# Patient Record
Sex: Female | Born: 1975 | Race: White | Hispanic: No | Marital: Married | Smoking: Current every day smoker
Health system: Southern US, Community
[De-identification: ages and names within clinical notes are randomized; demographics above are authoritative.]

## PROBLEM LIST (undated history)

## (undated) ENCOUNTER — Inpatient Hospital Stay (HOSPITAL_COMMUNITY): Payer: Self-pay

---

## 2013-07-22 ENCOUNTER — Encounter (HOSPITAL_COMMUNITY): Payer: Self-pay

## 2013-07-22 ENCOUNTER — Inpatient Hospital Stay (HOSPITAL_COMMUNITY)
Admission: AD | Admit: 2013-07-22 | Discharge: 2013-07-22 | Disposition: A | Discharge status: Home / Self Care | Payer: Medicaid Other | Source: Ambulatory Visit | Attending: Obstetrics & Gynecology | Admitting: Obstetrics & Gynecology

## 2013-07-22 DIAGNOSIS — O0930 Supervision of pregnancy with insufficient antenatal care, unspecified trimester: Secondary | ICD-10-CM | POA: Insufficient documentation

## 2013-07-22 DIAGNOSIS — O99330 Smoking (tobacco) complicating pregnancy, unspecified trimester: Secondary | ICD-10-CM | POA: Insufficient documentation

## 2013-07-22 DIAGNOSIS — M545 Low back pain: Secondary | ICD-10-CM

## 2013-07-22 DIAGNOSIS — O99891 Other specified diseases and conditions complicating pregnancy: Secondary | ICD-10-CM | POA: Insufficient documentation

## 2013-07-22 DIAGNOSIS — M549 Dorsalgia, unspecified: Secondary | ICD-10-CM | POA: Insufficient documentation

## 2013-07-22 DIAGNOSIS — O0933 Supervision of pregnancy with insufficient antenatal care, third trimester: Secondary | ICD-10-CM

## 2013-07-22 LAB — URINALYSIS, ROUTINE W REFLEX MICROSCOPIC
Bilirubin Urine: NEGATIVE
Hgb urine dipstick: NEGATIVE
Ketones, ur: NEGATIVE mg/dL
Nitrite: NEGATIVE
Protein, ur: NEGATIVE mg/dL
Urobilinogen, UA: 0.2 mg/dL (ref 0.0–1.0)

## 2013-07-22 NOTE — MAU Provider Note (Signed)
  History     CSN: [id number]  Arrival date and time: 07/22/13 1113   None     No chief complaint on file.  HPI This is a 37 y.o. female at [redacted]w[redacted]d who presents for checkup due to having no prenatal care. Just recently tried calling health Dept, states cannot get in. Wants to know what she is having. Has longstanding back pain that is no worse than usual. States no other complaints to me. "Just want to know if my baby is ok".  RN note: Patient states she has not had prenatal care and did not know she was pregnant until 3 months ago. Last period was on 5-31. States she has been having some back pain that she has had for a while. Swelling in wrists from carpel tunnel. Denies bleeding or leaking and reports fetal movement.       OB History   Grav Para Term Preterm Abortions TAB SAB Ect Mult Living   5 2 2  2  2   2       History reviewed. No pertinent past medical history.  History reviewed. No pertinent past surgical history.  Family History  Problem Relation Age of Onset  . Hypertension Mother   . Heart disease Mother   . Diabetes Father   . Cancer Father   . Diabetes Maternal Aunt     History  Substance Use Topics  . Smoking status: Current Every Day Smoker  . Smokeless tobacco: Never Used  . Alcohol Use: No    Allergies: No Known Allergies  No prescriptions prior to admission    Review of Systems  Constitutional: Negative for fever, chills and malaise/fatigue.  Gastrointestinal: Negative for nausea, vomiting, abdominal pain, diarrhea and constipation.  Genitourinary: Negative for dysuria.  Musculoskeletal: Positive for back pain.  Neurological: Negative for dizziness, focal weakness, weakness and headaches.   Physical Exam   Blood pressure 136/81, pulse 68, temperature 98.4 F (36.9 C), temperature source Oral, resp. rate 16, height 5' 7.5" (1.715 m), weight 101.787 kg (224 lb 6.4 oz), last menstrual period 12/29/2012, SpO2 99.00%.  Physical Exam   Constitutional: She is oriented to person, place, and time. She appears well-developed and well-nourished. No distress.  HENT:  Head: Normocephalic.  Cardiovascular: Normal rate.   Respiratory: Effort normal.  GI: Soft. She exhibits no distension and no mass. There is no tenderness. There is no rebound and no guarding.  Fundal height 30cm FHR + by RN  Genitourinary: No vaginal discharge found.  Cervical exam negative  Musculoskeletal: Normal range of motion.  Neurological: She is alert and oriented to person, place, and time.  Skin: Skin is warm and dry.  Psychiatric: She has a normal mood and affect.    MAU Course  Procedures   Assessment and Plan  A:  SIUP at [redacted]w[redacted]d        + fetal heart tones       Size = Dates  P:  WIll schedule out patient [place] prior to clinic visit      Will refer to clinic      Has PN vitamins  [facility] 07/22/2013, 12:21 PM

## 2013-07-22 NOTE — MAU Note (Signed)
Patient states she has not had prenatal care and did not know she was pregnant until 3 months ago. Last period was on 5-31. States she has been having some back pain that she has had for a while. Swelling in wrists from carpel tunnel. Denies bleeding or leaking and reports fetal movement.

## 2013-07-23 ENCOUNTER — Encounter: Payer: Self-pay | Admitting: Advanced Practice Midwife

## 2013-08-08 ENCOUNTER — Ambulatory Visit (INDEPENDENT_AMBULATORY_CARE_PROVIDER_SITE_OTHER): Payer: Medicaid Other | Admitting: Advanced Practice Midwife

## 2013-08-08 ENCOUNTER — Encounter: Payer: Self-pay | Admitting: Advanced Practice Midwife

## 2013-08-08 ENCOUNTER — Other Ambulatory Visit (HOSPITAL_COMMUNITY)
Admission: RE | Admit: 2013-08-08 | Discharge: 2013-08-08 | Disposition: A | Discharge status: Home / Self Care | Payer: Medicaid Other | Source: Ambulatory Visit | Attending: Advanced Practice Midwife | Admitting: Advanced Practice Midwife

## 2013-08-08 VITALS — BP 136/94 | Temp 98.5°F | Wt 225.2 lb

## 2013-08-08 DIAGNOSIS — Z124 Encounter for screening for malignant neoplasm of cervix: Secondary | ICD-10-CM | POA: Insufficient documentation

## 2013-08-08 DIAGNOSIS — O09529 Supervision of elderly multigravida, unspecified trimester: Secondary | ICD-10-CM

## 2013-08-08 DIAGNOSIS — Z1151 Encounter for screening for human papillomavirus (HPV): Secondary | ICD-10-CM | POA: Insufficient documentation

## 2013-08-08 DIAGNOSIS — O09523 Supervision of elderly multigravida, third trimester: Secondary | ICD-10-CM

## 2013-08-08 DIAGNOSIS — O0930 Supervision of pregnancy with insufficient antenatal care, unspecified trimester: Secondary | ICD-10-CM

## 2013-08-08 DIAGNOSIS — Z113 Encounter for screening for infections with a predominantly sexual mode of transmission: Secondary | ICD-10-CM | POA: Insufficient documentation

## 2013-08-08 DIAGNOSIS — O0933 Supervision of pregnancy with insufficient antenatal care, third trimester: Secondary | ICD-10-CM | POA: Insufficient documentation

## 2013-08-08 DIAGNOSIS — R87810 Cervical high risk human papillomavirus (HPV) DNA test positive: Secondary | ICD-10-CM | POA: Insufficient documentation

## 2013-08-08 LAB — POCT URINALYSIS DIP (DEVICE)
Bilirubin Urine: NEGATIVE
Glucose, UA: NEGATIVE mg/dL
Hgb urine dipstick: NEGATIVE
Ketones, ur: NEGATIVE mg/dL
Leukocytes, UA: NEGATIVE
NITRITE: NEGATIVE
Protein, ur: NEGATIVE mg/dL
Specific Gravity, Urine: 1.025 (ref 1.005–1.030)
Urobilinogen, UA: 0.2 mg/dL (ref 0.0–1.0)
pH: 7 (ref 5.0–8.0)

## 2013-08-08 NOTE — Progress Notes (Signed)
Pulse- 101  Pain/pressure-lower abd Weight gain 25-35lbs New ob packet given

## 2013-08-08 NOTE — Patient Instructions (Signed)
Third Trimester of Pregnancy  The third trimester is from week 29 through week 42, months 7 through 9. The third trimester is a time when the fetus is growing rapidly. At the end of the ninth month, the fetus is about 20 inches in length and weighs 6 10 pounds.   BODY CHANGES  Your body goes through many changes during pregnancy. The changes vary from woman to woman.    Your weight will continue to increase. You can expect to gain 25 35 pounds (11 16 kg) by the end of the pregnancy.   You may begin to get stretch marks on your hips, abdomen, and breasts.   You may urinate more often because the fetus is moving lower into your pelvis and pressing on your bladder.   You may develop or continue to have heartburn as a result of your pregnancy.   You may develop constipation because certain hormones are causing the muscles that push waste through your intestines to slow down.   You may develop hemorrhoids or swollen, bulging veins (varicose veins).   You may have pelvic pain because of the weight gain and pregnancy hormones relaxing your joints between the bones in your pelvis. Back aches may result from over exertion of the muscles supporting your posture.   Your breasts will continue to grow and be tender. A yellow discharge may leak from your breasts called colostrum.   Your belly button may stick out.   You may feel short of breath because of your expanding uterus.   You may notice the fetus "dropping," or moving lower in your abdomen.   You may have a bloody mucus discharge. This usually occurs a few days to a week before labor begins.   Your cervix becomes thin and soft (effaced) near your due date.  WHAT TO EXPECT AT YOUR PRENATAL EXAMS   You will have prenatal exams every 2 weeks until week 36. Then, you will have weekly prenatal exams. During a routine prenatal visit:   You will be weighed to make sure you and the fetus are growing normally.   Your blood pressure is taken.   Your abdomen will be  measured to track your baby's growth.   The fetal heartbeat will be listened to.   Any test results from the previous visit will be discussed.   You may have a cervical check near your due date to see if you have effaced.  At around 36 weeks, your caregiver will check your cervix. At the same time, your caregiver will also perform a test on the secretions of the vaginal tissue. This test is to determine if a type of bacteria, Group B streptococcus, is present. Your caregiver will explain this further.  Your caregiver may ask you:   What your birth plan is.   How you are feeling.   If you are feeling the baby move.   If you have had any abnormal symptoms, such as leaking fluid, bleeding, severe headaches, or abdominal cramping.   If you have any questions.  Other tests or screenings that may be performed during your third trimester include:   Blood tests that check for low iron levels (anemia).   Fetal testing to check the health, activity level, and growth of the fetus. Testing is done if you have certain medical conditions or if there are problems during the pregnancy.  FALSE LABOR  You may feel small, irregular contractions that eventually go away. These are called Braxton Hicks contractions, or   false labor. Contractions may last for hours, days, or even weeks before true labor sets in. If contractions come at regular intervals, intensify, or become painful, it is best to be seen by your caregiver.   SIGNS OF LABOR    Menstrual-like cramps.   Contractions that are 5 minutes apart or less.   Contractions that start on the top of the uterus and spread down to the lower abdomen and back.   A sense of increased pelvic pressure or back pain.   A watery or bloody mucus discharge that comes from the vagina.  If you have any of these signs before the 37th week of pregnancy, call your caregiver right away. You need to go to the hospital to get checked immediately.  HOME CARE INSTRUCTIONS    Avoid all  smoking, herbs, alcohol, and unprescribed drugs. These chemicals affect the formation and growth of the baby.   Follow your caregiver's instructions regarding medicine use. There are medicines that are either safe or unsafe to take during pregnancy.   Exercise only as directed by your caregiver. Experiencing uterine cramps is a good sign to stop exercising.   Continue to eat regular, healthy meals.   Wear a good support bra for breast tenderness.   Do not use hot tubs, steam rooms, or saunas.   Wear your seat belt at all times when driving.   Avoid raw meat, uncooked cheese, cat litter boxes, and soil used by cats. These carry germs that can cause birth defects in the baby.   Take your prenatal vitamins.   Try taking a stool softener (if your caregiver approves) if you develop constipation. Eat more high-fiber foods, such as fresh vegetables or fruit and whole grains. Drink plenty of fluids to keep your urine clear or pale yellow.   Take warm sitz baths to soothe any pain or discomfort caused by hemorrhoids. Use hemorrhoid cream if your caregiver approves.   If you develop varicose veins, wear support hose. Elevate your feet for 15 minutes, 3 4 times a day. Limit salt in your diet.   Avoid heavy lifting, wear low heal shoes, and practice good posture.   Rest a lot with your legs elevated if you have leg cramps or low back pain.   Visit your dentist if you have not gone during your pregnancy. Use a soft toothbrush to brush your teeth and be gentle when you floss.   A sexual relationship may be continued unless your caregiver directs you otherwise.   Do not travel far distances unless it is absolutely necessary and only with the approval of your caregiver.   Take prenatal classes to understand, practice, and ask questions about the labor and delivery.   Make a trial run to the hospital.   Pack your hospital bag.   Prepare the baby's nursery.   Continue to go to all your prenatal visits as directed  by your caregiver.  SEEK MEDICAL CARE IF:   You are unsure if you are in labor or if your water has broken.   You have dizziness.   You have mild pelvic cramps, pelvic pressure, or nagging pain in your abdominal area.   You have persistent nausea, vomiting, or diarrhea.   You have a bad smelling vaginal discharge.   You have pain with urination.  SEEK IMMEDIATE MEDICAL CARE IF:    You have a fever.   You are leaking fluid from your vagina.   You have spotting or bleeding from your vagina.     You have severe abdominal cramping or pain.   You have rapid weight loss or gain.   You have shortness of breath with chest pain.   You notice sudden or extreme swelling of your face, hands, ankles, feet, or legs.   You have not felt your baby move in over an hour.   You have severe headaches that do not go away with medicine.   You have vision changes.  Document Released: 07/12/2001 Document Revised: 03/20/2013 Document Reviewed: 09/18/2012  [employer] Patient Information 2014 [employer]

## 2013-08-08 NOTE — Addendum Note (Signed)
Addended by: Franchot MimesALFARO, Albertus Chiarelli on: 08/08/2013 05:20 PM   Modules accepted: Orders

## 2013-08-08 NOTE — Progress Notes (Signed)
New OB. See other note  Subjective:    Traci Love is a [id number] 5746w5d being seen today for her first obstetrical visit.  Her obstetrical history is significant for advanced maternal age. Patient does not intend to breast feed. Pregnancy history fully reviewed.  Patient reports no complaints.  Filed Vitals:   08/08/13 1451  BP: 136/94  Temp: 98.5 F (36.9 C)  Weight: 102.15 kg (225 lb 3.2 oz)    HISTORY: OB History  Gravida Para Term Preterm AB SAB TAB Ectopic Multiple Living  5 2 2  2 2    2     # Outcome Date GA Lbr Len/2nd Weight Sex Delivery Anes PTL Lv  5 CUR           4 SAB 2000          3 TRM 04/20/95    F SVD   Y  2 TRM 03/09/94    M SVD   Y  1 SAB 1994             History reviewed. No pertinent past medical history. History reviewed. No pertinent past surgical history. Family History  Problem Relation Age of Onset  . Hypertension Mother   . Heart disease Mother   . Diabetes Father   . Cancer Father   . Diabetes Maternal Aunt      Exam    Uterus:     Pelvic Exam:    Perineum: No Hemorrhoids   Vulva: Bartholin's, Urethra, Skene's normal   Vagina:  normal mucosa, normal discharge   pH:    Cervix: multiparous appearance, no cervical motion tenderness and friable, very irregular cervix, as with prior cervical laceration   Adnexa: normal adnexa and no mass, fullness, tenderness   Bony Pelvis: gynecoid  System: Breast:  normal appearance, no masses or tenderness   Skin: normal coloration and turgor, no rashes    Neurologic: oriented, grossly non-focal   Extremities: normal strength, tone, and muscle mass   HEENT neck supple with midline trachea   Mouth/Teeth mucous membranes moist, pharynx normal without lesions   Neck supple and no masses   Cardiovascular: regular rate and rhythm, no murmurs or gallops   Respiratory:  appears well, vitals normal, no respiratory distress, acyanotic, normal RR, ear and throat exam is normal, neck free of mass or  lymphadenopathy, chest clear, no wheezing, crepitations, rhonchi, normal symmetric air entry   Abdomen: soft, non-tender; bowel sounds normal; no masses,  no organomegaly   Urinary: urethral meatus normal      Assessment:    Pregnancy: [id number] There are no active problems to display for this patient. SIUP at 2946w5d  Friable cervix, dilated 1/2 cm, not effaced, no contractions Late to care AMA Desires BTL      Plan:     Initial labs drawn. Prenatal vitamins. Problem list reviewed and updated. Genetic Screening discussed Quad Screen: Too Late.  Ultrasound discussed; fetal survey: ordered.  Follow up in 2 weeks. 50% of 30 min visit spent on counseling and coordination of care.  US scheduled next week at [facility].  BTL papers signed today Glucola and other labs done today   [facility] LLCWILLIAMS,Mina Babula 08/08/2013

## 2013-08-09 LAB — OBSTETRIC PANEL
Antibody Screen: NEGATIVE
Basophils Absolute: 0.1 10*3/uL (ref 0.0–0.1)
Basophils Relative: 0 % (ref 0–1)
EOS ABS: 0.2 10*3/uL (ref 0.0–0.7)
Eosinophils Relative: 2 % (ref 0–5)
HCT: 35.4 % — ABNORMAL LOW (ref 36.0–46.0)
Hemoglobin: 11.9 g/dL — ABNORMAL LOW (ref 12.0–15.0)
Hepatitis B Surface Ag: NEGATIVE
Lymphocytes Relative: 12 % (ref 12–46)
Lymphs Abs: 1.9 10*3/uL (ref 0.7–4.0)
MCH: 32.7 pg (ref 26.0–34.0)
MCHC: 33.6 g/dL (ref 30.0–36.0)
MCV: 97.3 fL (ref 78.0–100.0)
Monocytes Absolute: 0.8 10*3/uL (ref 0.1–1.0)
Monocytes Relative: 5 % (ref 3–12)
NEUTROS PCT: 81 % — AB (ref 43–77)
Neutro Abs: 12.6 10*3/uL — ABNORMAL HIGH (ref 1.7–7.7)
PLATELETS: 423 10*3/uL — AB (ref 150–400)
RBC: 3.64 MIL/uL — AB (ref 3.87–5.11)
RDW: 13.4 % (ref 11.5–15.5)
Rh Type: POSITIVE
Rubella: 1.84 Index — ABNORMAL HIGH (ref <0.90)
WBC: 15.6 10*3/uL — ABNORMAL HIGH (ref 4.0–10.5)

## 2013-08-09 LAB — CYSTIC FIBROSIS DIAGNOSTIC STUDY

## 2013-08-09 LAB — PRESCRIPTION MONITORING PROFILE (19 PANEL)
Amphetamine/Meth: NEGATIVE ng/mL
Barbiturate Screen, Urine: NEGATIVE ng/mL
Benzodiazepine Screen, Urine: NEGATIVE ng/mL
Buprenorphine, Urine: NEGATIVE ng/mL
CARISOPRODOL, URINE: NEGATIVE ng/mL
Cannabinoid Scrn, Ur: NEGATIVE ng/mL
Cocaine Metabolites: NEGATIVE ng/mL
Creatinine, Urine: 118.68 mg/dL (ref >20.0)
Fentanyl, Ur: NEGATIVE ng/mL
MDMA URINE: NEGATIVE ng/mL
METHAQUALONE SCREEN (URINE): NEGATIVE ng/mL
Meperidine, Ur: NEGATIVE ng/mL
Methadone Screen, Urine: NEGATIVE ng/mL
NITRITES URINE, INITIAL: NEGATIVE ug/mL
Opiate Screen, Urine: NEGATIVE ng/mL
Oxycodone Screen, Ur: NEGATIVE ng/mL
PH URINE, INITIAL: 7 pH (ref 4.5–8.9)
PROPOXYPHENE: NEGATIVE ng/mL
Phencyclidine, Ur: NEGATIVE ng/mL
Tapentadol, urine: NEGATIVE ng/mL
Tramadol Scrn, Ur: NEGATIVE ng/mL
Zolpidem, Urine: NEGATIVE ng/mL

## 2013-08-09 LAB — HIV ANTIBODY (ROUTINE TESTING W REFLEX): HIV: NONREACTIVE

## 2013-08-09 LAB — GLUCOSE TOLERANCE, 1 HOUR (50G) W/O FASTING: GLUCOSE 1 HOUR GTT: 99 mg/dL (ref 70–140)

## 2013-08-10 LAB — CULTURE, OB URINE
Colony Count: NO GROWTH
ORGANISM ID, BACTERIA: NO GROWTH

## 2013-08-12 ENCOUNTER — Encounter: Payer: Self-pay | Admitting: Advanced Practice Midwife

## 2013-08-12 DIAGNOSIS — R87612 Low grade squamous intraepithelial lesion on cytologic smear of cervix (LGSIL): Secondary | ICD-10-CM | POA: Insufficient documentation

## 2013-08-15 ENCOUNTER — Ambulatory Visit (HOSPITAL_COMMUNITY)
Admission: RE | Admit: 2013-08-15 | Discharge: 2013-08-15 | Disposition: A | Discharge status: Home / Self Care | Payer: Medicaid Other | Source: Ambulatory Visit | Attending: Advanced Practice Midwife | Admitting: Advanced Practice Midwife

## 2013-08-15 VITALS — BP 131/79 | HR 89 | Wt 230.2 lb

## 2013-08-15 DIAGNOSIS — O0933 Supervision of pregnancy with insufficient antenatal care, third trimester: Secondary | ICD-10-CM

## 2013-08-15 DIAGNOSIS — Z1389 Encounter for screening for other disorder: Secondary | ICD-10-CM | POA: Insufficient documentation

## 2013-08-15 DIAGNOSIS — O358XX0 Maternal care for other (suspected) fetal abnormality and damage, not applicable or unspecified: Secondary | ICD-10-CM | POA: Insufficient documentation

## 2013-08-15 DIAGNOSIS — Z363 Encounter for antenatal screening for malformations: Secondary | ICD-10-CM | POA: Insufficient documentation

## 2013-08-15 DIAGNOSIS — O0930 Supervision of pregnancy with insufficient antenatal care, unspecified trimester: Secondary | ICD-10-CM | POA: Insufficient documentation

## 2013-08-15 DIAGNOSIS — O09529 Supervision of elderly multigravida, unspecified trimester: Secondary | ICD-10-CM | POA: Insufficient documentation

## 2013-08-15 NOTE — Progress Notes (Signed)
Traci Love  was seen today for an ultrasound appointment.  See full report in [facility].  Impression: Single IUP at 32 5/7 weeks by unsure dates and 36 0/7 weeks by ultrasound today Late onset of care, unsure LMP (reports +/- one week) Limited views of the anatomy obtained due to late gestational age No gross anomalies noted- the fetal heart was suboptimally visualized Homero FellersFrank breech presentation Somewhat calcified placenta Normal amniotic fluid volume  Briefly discussed management options for breech presentation - ECV vs. planned C-section With poor dates, would be hesitant to offer ECV at this point.  Recommendations: With > 3 week dating discrepancy, history of tobacco use and calcified placenta - feel pregnancy is more consistent with a [redacted] week gestation - recommend adjusting EDC accordingly. Recommend follow up in 3 weeks - if still breech at that time, would consider amniocentesis for fetal lung maturity prior to scheduled C-section or ECV  Alpha GulaPaul Reymundo Winship, MD

## 2013-08-22 ENCOUNTER — Ambulatory Visit (INDEPENDENT_AMBULATORY_CARE_PROVIDER_SITE_OTHER): Payer: Medicaid Other | Admitting: Family Medicine

## 2013-08-22 ENCOUNTER — Encounter: Payer: Self-pay | Admitting: Family Medicine

## 2013-08-22 VITALS — BP 128/85 | Temp 97.6°F | Wt 230.9 lb

## 2013-08-22 DIAGNOSIS — O09529 Supervision of elderly multigravida, unspecified trimester: Secondary | ICD-10-CM

## 2013-08-22 DIAGNOSIS — Z23 Encounter for immunization: Secondary | ICD-10-CM

## 2013-08-22 DIAGNOSIS — O0933 Supervision of pregnancy with insufficient antenatal care, third trimester: Secondary | ICD-10-CM

## 2013-08-22 DIAGNOSIS — O0930 Supervision of pregnancy with insufficient antenatal care, unspecified trimester: Secondary | ICD-10-CM

## 2013-08-22 LAB — POCT URINALYSIS DIP (DEVICE)
BILIRUBIN URINE: NEGATIVE
Glucose, UA: NEGATIVE mg/dL
Hgb urine dipstick: NEGATIVE
Ketones, ur: NEGATIVE mg/dL
LEUKOCYTES UA: NEGATIVE
NITRITE: NEGATIVE
PH: 6.5 (ref 5.0–8.0)
Protein, ur: NEGATIVE mg/dL
Specific Gravity, Urine: 1.01 (ref 1.005–1.030)
UROBILINOGEN UA: 0.2 mg/dL (ref 0.0–1.0)

## 2013-08-22 LAB — OB RESULTS CONSOLE GBS: GBS: NEGATIVE

## 2013-08-22 MED ORDER — TETANUS-DIPHTH-ACELL PERTUSSIS 5-2.5-18.5 LF-MCG/0.5 IM SUSP: 0.5000 mL | Freq: Once | INTRAMUSCULAR | Status: DC

## 2013-08-22 NOTE — Progress Notes (Signed)
   Subjective:    Traci Love is a [id number] 6177w0d being seen today for her first obstetrical visit.  Her obstetrical history is significant for advanced maternal age, smoker and late to care.  Pregnancy history fully reviewed.  Patient reports no bleeding and Braxton-Hicks contractions.  Filed Vitals:   08/22/13 0902  BP: 128/85  Temp: 97.6 F (36.4 C)  Weight: 230 lb 14.4 oz (104.736 kg)    HISTORY: OB History  Gravida Para Term Preterm AB SAB TAB Ectopic Multiple Living  5 2 2  2 2    2     # Outcome Date GA Lbr Len/2nd Weight Sex Delivery Anes PTL Lv  5 CUR           4 SAB 2000          3 TRM 04/20/95    F SVD   Y  2 TRM 03/09/94    M SVD   Y  1 SAB 1994             History reviewed. No pertinent past medical history. History reviewed. No pertinent past surgical history. Family History  Problem Relation Age of Onset  . Hypertension Mother   . Heart disease Mother   . Cancer Father     lung  . Diabetes Maternal Aunt      Exam    Uterus:    FH 37  Pelvic Exam:    Perineum: Normal Perineum   Vulva: Bartholin's, Urethra, Skene's normal   Vagina:  normal mucosa, normal discharge   Cervix: no lesions   Adnexa: not evaluated   Bony Pelvis: average   Skin: normal coloration and turgor, no rashes    Neurologic: normal   Extremities: normal strength, tone, and muscle mass   HEENT sclera clear, anicteric   Mouth/Teeth mucous membranes moist, pharynx normal without lesions   Neck supple   Cardiovascular: regular rate and rhythm   Respiratory:  appears well, vitals normal, no respiratory distress, acyanotic, normal RR, ear and throat exam is normal, neck free of mass or lymphadenopathy, chest clear, no wheezing, crepitations, rhonchi, normal symmetric air entry   Abdomen: soft, non-tender; bowel sounds normal; no masses,  no organomegaly      Assessment:    Pregnancy: [id number] Patient Active Problem List   Diagnosis Date Noted  . AMA (advanced maternal age)  multigravida 35+ 08/08/2013    Priority: High  . Low grade squamous intraepithelial lesion (LGSIL) on Papanicolaou smear of cervix 08/12/2013    Priority: Medium  . Late prenatal care complicating pregnancy in third trimester 08/08/2013    Priority: Medium        Plan:     Initial labs drawn. Prenatal vitamins. Problem list reviewed and updated. Genetic Screening discussed Quad Screen: too late to care.  Ultrasound discussed; fetal survey: results reviewed.  Follow up in 1 weeks. GBS today Scheduled for colpo Schedule f/u u/s for growth--was breech last time  [name] 08/22/2013

## 2013-08-22 NOTE — Progress Notes (Signed)
P=104,   Thinks she felt braxton hicks contractions Monday, none since then.

## 2013-08-22 NOTE — Patient Instructions (Signed)
Third Trimester of Pregnancy The third trimester is from week 29 through week 42, months 7 through 9. The third trimester is a time when the fetus is growing rapidly. At the end of the ninth month, the fetus is about 20 inches in length and weighs 6 10 pounds.  BODY CHANGES Your body goes through many changes during pregnancy. The changes vary from woman to woman.   Your weight will continue to increase. You can expect to gain 25 35 pounds (11 16 kg) by the end of the pregnancy.  You may begin to get stretch marks on your hips, abdomen, and breasts.  You may urinate more often because the fetus is moving lower into your pelvis and pressing on your bladder.  You may develop or continue to have heartburn as a result of your pregnancy.  You may develop constipation because certain hormones are causing the muscles that push waste through your intestines to slow down.  You may develop hemorrhoids or swollen, bulging veins (varicose veins).  You may have pelvic pain because of the weight gain and pregnancy hormones relaxing your joints between the bones in your pelvis. Back aches may result from over exertion of the muscles supporting your posture.  Your breasts will continue to grow and be tender. A yellow discharge may leak from your breasts called colostrum.  Your belly button may stick out.  You may feel short of breath because of your expanding uterus.  You may notice the fetus "dropping," or moving lower in your abdomen.  You may have a bloody mucus discharge. This usually occurs a few days to a week before labor begins.  Your cervix becomes thin and soft (effaced) near your due date. WHAT TO EXPECT AT YOUR PRENATAL EXAMS  You will have prenatal exams every 2 weeks until week 36. Then, you will have weekly prenatal exams. During a routine prenatal visit:  You will be weighed to make sure you and the fetus are growing normally.  Your blood pressure is taken.  Your abdomen will  be measured to track your baby's growth.  The fetal heartbeat will be listened to.  Any test results from the previous visit will be discussed.  You may have a cervical check near your due date to see if you have effaced. At around 36 weeks, your caregiver will check your cervix. At the same time, your caregiver will also perform a test on the secretions of the vaginal tissue. This test is to determine if a type of bacteria, Group B streptococcus, is present. Your caregiver will explain this further. Your caregiver may ask you:  What your birth plan is.  How you are feeling.  If you are feeling the baby move.  If you have had any abnormal symptoms, such as leaking fluid, bleeding, severe headaches, or abdominal cramping.  If you have any questions. Other tests or screenings that may be performed during your third trimester include:  Blood tests that check for low iron levels (anemia).  Fetal testing to check the health, activity level, and growth of the fetus. Testing is done if you have certain medical conditions or if there are problems during the pregnancy. FALSE LABOR You may feel small, irregular contractions that eventually go away. These are called Braxton Hicks contractions, or false labor. Contractions may last for hours, days, or even weeks before true labor sets in. If contractions come at regular intervals, intensify, or become painful, it is best to be seen by your caregiver.    SIGNS OF LABOR   Menstrual-like cramps.  Contractions that are 5 minutes apart or less.  Contractions that start on the top of the uterus and spread down to the lower abdomen and back.  A sense of increased pelvic pressure or back pain.  A watery or bloody mucus discharge that comes from the vagina. If you have any of these signs before the 37th week of pregnancy, call your caregiver right away. You need to go to the hospital to get checked immediately. HOME CARE INSTRUCTIONS   Avoid all  smoking, herbs, alcohol, and unprescribed drugs. These chemicals affect the formation and growth of the baby.  Follow your caregiver's instructions regarding medicine use. There are medicines that are either safe or unsafe to take during pregnancy.  Exercise only as directed by your caregiver. Experiencing uterine cramps is a good sign to stop exercising.  Continue to eat regular, healthy meals.  Wear a good support bra for breast tenderness.  Do not use hot tubs, steam rooms, or saunas.  Wear your seat belt at all times when driving.  Avoid raw meat, uncooked cheese, cat litter boxes, and soil used by cats. These carry germs that can cause birth defects in the baby.  Take your prenatal vitamins.  Try taking a stool softener (if your caregiver approves) if you develop constipation. Eat more high-fiber foods, such as fresh vegetables or fruit and whole grains. Drink plenty of fluids to keep your urine clear or pale yellow.  Take warm sitz baths to soothe any pain or discomfort caused by hemorrhoids. Use hemorrhoid cream if your caregiver approves.  If you develop varicose veins, wear support hose. Elevate your feet for 15 minutes, 3 4 times a day. Limit salt in your diet.  Avoid heavy lifting, wear low heal shoes, and practice good posture.  Rest a lot with your legs elevated if you have leg cramps or low back pain.  Visit your dentist if you have not gone during your pregnancy. Use a soft toothbrush to brush your teeth and be gentle when you floss.  A sexual relationship may be continued unless your caregiver directs you otherwise.  Do not travel far distances unless it is absolutely necessary and only with the approval of your caregiver.  Take prenatal classes to understand, practice, and ask questions about the labor and delivery.  Make a trial run to the hospital.  Pack your hospital bag.  Prepare the baby's nursery.  Continue to go to all your prenatal visits as directed  by your caregiver. SEEK MEDICAL CARE IF:  You are unsure if you are in labor or if your water has broken.  You have dizziness.  You have mild pelvic cramps, pelvic pressure, or nagging pain in your abdominal area.  You have persistent nausea, vomiting, or diarrhea.  You have a bad smelling vaginal discharge.  You have pain with urination. SEEK IMMEDIATE MEDICAL CARE IF:   You have a fever.  You are leaking fluid from your vagina.  You have spotting or bleeding from your vagina.  You have severe abdominal cramping or pain.  You have rapid weight loss or gain.  You have shortness of breath with chest pain.  You notice sudden or extreme swelling of your face, hands, ankles, feet, or legs.  You have not felt your baby move in over an hour.  You have severe headaches that do not go away with medicine.  You have vision changes. Document Released: 07/12/2001 Document Revised: 03/20/2013 Document Reviewed:   09/18/2012 [employer] Patient Information 2014 [employer].  Breastfeeding Deciding to breastfeed is one of the best choices you can make for you and your baby. A change in hormones during pregnancy causes your breast tissue to grow and increases the number and size of your milk ducts. These hormones also allow proteins, sugars, and fats from your blood supply to make breast milk in your milk-producing glands. Hormones prevent breast milk from being released before your baby is born as well as prompt milk flow after birth. Once breastfeeding has begun, thoughts of your baby, as well as his or her sucking or crying, can stimulate the release of milk from your milk-producing glands.  BENEFITS OF BREASTFEEDING For Your Baby  Your first milk (colostrum) helps your baby's digestive system function better.   There are antibodies in your milk that help your baby fight off infections.   Your baby has a lower incidence of asthma, allergies, and sudden infant death syndrome.    The nutrients in breast milk are better for your baby than infant formulas and are designed uniquely for your baby's needs.   Breast milk improves your baby's brain development.   Your baby is less likely to develop other conditions, such as childhood obesity, asthma, or type 2 diabetes mellitus.  For You   Breastfeeding helps to create a very special bond between you and your baby.   Breastfeeding is convenient. Breast milk is always available at the correct temperature and costs nothing.   Breastfeeding helps to burn calories and helps you lose the weight gained during pregnancy.   Breastfeeding makes your uterus contract to its prepregnancy size faster and slows bleeding (lochia) after you give birth.   Breastfeeding helps to lower your risk of developing type 2 diabetes mellitus, osteoporosis, and breast or ovarian cancer later in life. SIGNS THAT YOUR BABY IS HUNGRY Early Signs of Hunger  Increased alertness or activity.  Stretching.  Movement of the head from side to side.  Movement of the head and opening of the mouth when the corner of the mouth or cheek is stroked (rooting).  Increased sucking sounds, smacking lips, cooing, sighing, or squeaking.  Hand-to-mouth movements.  Increased sucking of fingers or hands. Late Signs of Hunger  Fussing.  Intermittent crying. Extreme Signs of Hunger Signs of extreme hunger will require calming and consoling before your baby will be able to breastfeed successfully. Do not wait for the following signs of extreme hunger to occur before you initiate breastfeeding:   Restlessness.  A loud, strong cry.   Screaming. BREASTFEEDING BASICS Breastfeeding Initiation  Find a comfortable place to sit or lie down, with your neck and back well supported.  Place a pillow or rolled up blanket under your baby to bring him or her to the level of your breast (if you are seated). Nursing pillows are specially designed to help  support your arms and your baby while you breastfeed.  Make sure that your baby's abdomen is facing your abdomen.   Gently massage your breast. With your fingertips, massage from your chest wall toward your nipple in a circular motion. This encourages milk flow. You may need to continue this action during the feeding if your milk flows slowly.  Support your breast with 4 fingers underneath and your thumb above your nipple. Make sure your fingers are well away from your nipple and your baby's mouth.   Stroke your baby's lips gently with your finger or nipple.   When your baby's mouth is   open wide enough, quickly bring your baby to your breast, placing your entire nipple and as much of the colored area around your nipple (areola) as possible into your baby's mouth.   More areola should be visible above your baby's upper lip than below the lower lip.   Your baby's tongue should be between his or her lower gum and your breast.   Ensure that your baby's mouth is correctly positioned around your nipple (latched). Your baby's lips should create a seal on your breast and be turned out (everted).  It is common for your baby to suck about 2 3 minutes in order to start the flow of breast milk. Latching Teaching your baby how to latch on to your breast properly is very important. An improper latch can cause nipple pain and decreased milk supply for you and poor weight gain in your baby. Also, if your baby is not latched onto your nipple properly, he or she may swallow some air during feeding. This can make your baby fussy. Burping your baby when you switch breasts during the feeding can help to get rid of the air. However, teaching your baby to latch on properly is still the best way to prevent fussiness from swallowing air while breastfeeding. Signs that your baby has successfully latched on to your nipple:    Silent tugging or silent sucking, without causing you pain.   Swallowing heard  between every 3 4 sucks.    Muscle movement above and in front of his or her ears while sucking.  Signs that your baby has not successfully latched on to nipple:   Sucking sounds or smacking sounds from your baby while breastfeeding.  Nipple pain. If you think your baby has not latched on correctly, slip your finger into the corner of your baby's mouth to break the suction and place it between your baby's gums. Attempt breastfeeding initiation again. Signs of Successful Breastfeeding Signs from your baby:   A gradual decrease in the number of sucks or complete cessation of sucking.   Falling asleep.   Relaxation of his or her body.   Retention of a small amount of milk in his or her mouth.   Letting go of your breast by himself or herself. Signs from you:  Breasts that have increased in firmness, weight, and size 1 3 hours after feeding.   Breasts that are softer immediately after breastfeeding.  Increased milk volume, as well as a change in milk consistency and color by the 5th day of breastfeeding.   Nipples that are not sore, cracked, or bleeding. Signs That Your Baby is Getting Enough Milk  Wetting at least 3 diapers in a 24-hour period. The urine should be clear and pale yellow by age 5 days.  At least 3 stools in a 24-hour period by age 5 days. The stool should be soft and yellow.  At least 3 stools in a 24-hour period by age 7 days. The stool should be seedy and yellow.  No loss of weight greater than 10% of birth weight during the first 3 days of age.  Average weight gain of 4 7 ounces (120 210 mL) per week after age 4 days.  Consistent daily weight gain by age 5 days, without weight loss after the age of 2 weeks. After a feeding, your baby may spit up a small amount. This is common. BREASTFEEDING FREQUENCY AND DURATION Frequent feeding will help you make more milk and can prevent sore nipples and breast engorgement.   Breastfeed when you feel the need to  reduce the fullness of your breasts or when your baby shows signs of hunger. This is called "breastfeeding on demand." Avoid introducing a pacifier to your baby while you are working to establish breastfeeding (the first 4 6 weeks after your baby is born). After this time you may choose to use a pacifier. Research has shown that pacifier use during the first year of a baby's life decreases the risk of sudden infant death syndrome (SIDS). Allow your baby to feed on each breast as long as he or she wants. Breastfeed until your baby is finished feeding. When your baby unlatches or falls asleep while feeding from the first breast, offer the second breast. Because newborns are often sleepy in the first few weeks of life, you may need to awaken your baby to get him or her to feed. Breastfeeding times will vary from baby to baby. However, the following rules can serve as a guide to help you ensure that your baby is properly fed:  Newborns (babies 4 weeks of age or younger) may breastfeed every 1 3 hours.  Newborns should not go longer than 3 hours during the day or 5 hours during the night without breastfeeding.  You should breastfeed your baby a minimum of 8 times in a 24-hour period until you begin to introduce solid foods to your baby at around 6 months of age. BREAST MILK PUMPING Pumping and storing breast milk allows you to ensure that your baby is exclusively fed your breast milk, even at times when you are unable to breastfeed. This is especially important if you are going back to work while you are still breastfeeding or when you are not able to be present during feedings. Your lactation consultant can give you guidelines on how long it is safe to store breast milk.  A breast pump is a machine that allows you to pump milk from your breast into a sterile bottle. The pumped breast milk can then be stored in a refrigerator or freezer. Some breast pumps are operated by hand, while others use electricity. Ask  your lactation consultant which type will work best for you. Breast pumps can be purchased, but some hospitals and breastfeeding support groups lease breast pumps on a monthly basis. A lactation consultant can teach you how to hand express breast milk, if you prefer not to use a pump.  CARING FOR YOUR BREASTS WHILE YOU BREASTFEED Nipples can become dry, cracked, and sore while breastfeeding. The following recommendations can help keep your breasts moisturized and healthy:  Avoid using soap on your nipples.   Wear a supportive bra. Although not required, special nursing bras and tank tops are designed to allow access to your breasts for breastfeeding without taking off your entire bra or top. Avoid wearing underwire style bras or extremely tight bras.  Air dry your nipples for 3 4minutes after each feeding.   Use only cotton bra pads to absorb leaked breast milk. Leaking of breast milk between feedings is normal.   Use lanolin on your nipples after breastfeeding. Lanolin helps to maintain your skin's normal moisture barrier. If you use pure lanolin you do not need to wash it off before feeding your baby again. Pure lanolin is not toxic to your baby. You may also hand express a few drops of breast milk and gently massage that milk into your nipples and allow the milk to air dry. In the first few weeks after giving birth, some women   experience extremely full breasts (engorgement). Engorgement can make your breasts feel heavy, warm, and tender to the touch. Engorgement peaks within 3 5 days after you give birth. The following recommendations can help ease engorgement:  Completely empty your breasts while breastfeeding or pumping. You may want to start by applying warm, moist heat (in the shower or with warm water-soaked hand towels) just before feeding or pumping. This increases circulation and helps the milk flow. If your baby does not completely empty your breasts while breastfeeding, pump any extra  milk after he or she is finished.  Wear a snug bra (nursing or regular) or tank top for 1 2 days to signal your body to slightly decrease milk production.  Apply ice packs to your breasts, unless this is too uncomfortable for you.  Make sure that your baby is latched on and positioned properly while breastfeeding. If engorgement persists after 48 hours of following these recommendations, contact your health care provider or a lactation consultant. OVERALL HEALTH CARE RECOMMENDATIONS WHILE BREASTFEEDING  Eat healthy foods. Alternate between meals and snacks, eating 3 of each per day. Because what you eat affects your breast milk, some of the foods may make your baby more irritable than usual. Avoid eating these foods if you are sure that they are negatively affecting your baby.  Drink milk, fruit juice, and water to satisfy your thirst (about 10 glasses a day).   Rest often, relax, and continue to take your prenatal vitamins to prevent fatigue, stress, and anemia.  Continue breast self-awareness checks.  Avoid chewing and smoking tobacco.  Avoid alcohol and drug use. Some medicines that may be harmful to your baby can pass through breast milk. It is important to ask your health care provider before taking any medicine, including all over-the-counter and prescription medicine as well as vitamin and herbal supplements. It is possible to become pregnant while breastfeeding. If birth control is desired, ask your health care provider about options that will be safe for your baby. SEEK MEDICAL CARE IF:   You feel like you want to stop breastfeeding or have become frustrated with breastfeeding.  You have painful breasts or nipples.  Your nipples are cracked or bleeding.  Your breasts are red, tender, or warm.  You have a swollen area on either breast.  You have a fever or chills.  You have nausea or vomiting.  You have drainage other than breast milk from your nipples.  Your breasts  do not become full before feedings by the 5th day after you give birth.  You feel sad and depressed.  Your baby is too sleepy to eat well.  Your baby is having trouble sleeping.   Your baby is wetting less than 3 diapers in a 24-hour period.  Your baby has less than 3 stools in a 24-hour period.  Your baby's skin or the white part of his or her eyes becomes yellow.   Your baby is not gaining weight by 5 days of age. SEEK IMMEDIATE MEDICAL CARE IF:   Your baby is overly tired (lethargic) and does not want to wake up and feed.  Your baby develops an unexplained fever. Document Released: 07/18/2005 Document Revised: 03/20/2013 Document Reviewed: 01/09/2013 [employer] Patient Information 2014 [employer].  

## 2013-08-24 LAB — CULTURE, BETA STREP (GROUP B ONLY)

## 2013-08-26 ENCOUNTER — Encounter: Payer: Self-pay | Admitting: Family Medicine

## 2013-08-27 ENCOUNTER — Encounter: Payer: Medicaid Other | Admitting: Obstetrics and Gynecology

## 2013-08-28 ENCOUNTER — Inpatient Hospital Stay (HOSPITAL_COMMUNITY)
Admission: AD | Admit: 2013-08-28 | Discharge: 2013-08-28 | Disposition: A | Discharge status: Home / Self Care | Payer: Medicaid Other | Source: Ambulatory Visit | Attending: Obstetrics & Gynecology | Admitting: Obstetrics & Gynecology

## 2013-08-28 ENCOUNTER — Inpatient Hospital Stay (HOSPITAL_COMMUNITY): Payer: Medicaid Other

## 2013-08-28 ENCOUNTER — Encounter (HOSPITAL_COMMUNITY): Payer: Self-pay

## 2013-08-28 DIAGNOSIS — O479 False labor, unspecified: Secondary | ICD-10-CM | POA: Insufficient documentation

## 2013-08-28 NOTE — [facility] Note (Signed)
Pt G5 P2 at 37.6wks, having contractions and brownish discharge.

## 2013-08-28 NOTE — Discharge Instructions (Signed)
Abdominal Pain During Pregnancy  Belly (abdominal) pain is common during pregnancy. Most of the time, it is not a serious problem. Other times, it can be a sign that something is wrong with the pregnancy. Always tell your doctor if you have belly pain.  HOME CARE  Monitor your belly pain for any changes. The following actions may help you feel better:  · Do not have sex (intercourse) or put anything in your vagina until you feel better.  · Rest until your pain stops.  · Drink clear fluids if you feel sick to your stomach (nauseous). Do not eat solid food until you feel better.  · Only take medicine as told by your doctor.  · Keep all doctor visits as told.  GET HELP RIGHT AWAY IF:   · You are bleeding, leaking fluid, or pieces of tissue come out of your vagina.  · You have more pain or cramping.  · You keep throwing up (vomiting).  · You have pain when you pee (urinate) or have blood in your pee.  · You have a fever.  · You do not feel your baby moving as much.  · You feel very weak or feel like passing out.  · You have trouble breathing, with or without belly pain.  · You have a very bad headache and belly pain.  · You have fluid leaking from your vagina and belly pain.  · You keep having watery poop (diarrhea).  · Your belly pain does not go away after resting, or the pain gets worse.  MAKE SURE YOU:   · Understand these instructions.  · Will watch your condition.  · Will get help right away if you are not doing well or get worse.  Document Released: 07/06/2009 Document Revised: 03/20/2013 Document Reviewed: 02/14/2013  [employer]® Patient Information ©2014 [employer].

## 2013-08-29 ENCOUNTER — Ambulatory Visit (INDEPENDENT_AMBULATORY_CARE_PROVIDER_SITE_OTHER): Payer: Medicaid Other | Admitting: Obstetrics & Gynecology

## 2013-08-29 ENCOUNTER — Encounter: Payer: Self-pay | Admitting: *Deleted

## 2013-08-29 VITALS — BP 132/84

## 2013-08-29 DIAGNOSIS — O0930 Supervision of pregnancy with insufficient antenatal care, unspecified trimester: Secondary | ICD-10-CM

## 2013-08-29 DIAGNOSIS — O0933 Supervision of pregnancy with insufficient antenatal care, third trimester: Secondary | ICD-10-CM

## 2013-08-29 DIAGNOSIS — O321XX0 Maternal care for breech presentation, not applicable or unspecified: Secondary | ICD-10-CM

## 2013-08-29 DIAGNOSIS — O09529 Supervision of elderly multigravida, unspecified trimester: Secondary | ICD-10-CM

## 2013-08-29 LAB — POCT URINALYSIS DIP (DEVICE)
Bilirubin Urine: NEGATIVE
Glucose, UA: NEGATIVE mg/dL
HGB URINE DIPSTICK: NEGATIVE
KETONES UR: NEGATIVE mg/dL
Leukocytes, UA: NEGATIVE
Nitrite: NEGATIVE
PROTEIN: NEGATIVE mg/dL
Urobilinogen, UA: 0.2 mg/dL (ref 0.0–1.0)
pH: 6 (ref 5.0–8.0)

## 2013-08-29 NOTE — Progress Notes (Signed)
Persistent breech presentation, poor dating by 36 weeks scan, Dr. Claudean SeveranceWhitecar hesistant about her getting an ECV.  Will follow up 09/05/13 scan, and discuss options for management.  No other complaints or concerns.  Fetal movement and labor precautions reviewed.

## 2013-08-29 NOTE — Progress Notes (Signed)
P= 88 States "I had some reddish discharge at 0500 this morning. I got checked at [facility] last night to get checked, was 1cm. Contractions are far spaced though."  C/o of lower abdominal/pelvic pressure and lower back pain.  Concerned about baby's breech presentation.

## 2013-08-29 NOTE — Patient Instructions (Signed)
Return to clinic for any obstetric concerns or go to [facility] for evaluation  

## 2013-08-31 ENCOUNTER — Inpatient Hospital Stay (HOSPITAL_COMMUNITY)
Admission: AD | Admit: 2013-08-31 | Discharge: 2013-09-03 | DRG: 766 | Disposition: A | Discharge status: Home / Self Care | Payer: Medicaid Other | Source: Ambulatory Visit | Attending: Obstetrics and Gynecology | Admitting: Obstetrics and Gynecology

## 2013-08-31 ENCOUNTER — Inpatient Hospital Stay (HOSPITAL_COMMUNITY): Payer: Medicaid Other

## 2013-08-31 ENCOUNTER — Encounter (HOSPITAL_COMMUNITY): Payer: Self-pay

## 2013-08-31 DIAGNOSIS — Z3490 Encounter for supervision of normal pregnancy, unspecified, unspecified trimester: Secondary | ICD-10-CM

## 2013-08-31 DIAGNOSIS — O99334 Smoking (tobacco) complicating childbirth: Secondary | ICD-10-CM | POA: Diagnosis present

## 2013-08-31 DIAGNOSIS — O0930 Supervision of pregnancy with insufficient antenatal care, unspecified trimester: Secondary | ICD-10-CM

## 2013-08-31 DIAGNOSIS — O322XX0 Maternal care for transverse and oblique lie, not applicable or unspecified: Secondary | ICD-10-CM | POA: Diagnosis present

## 2013-08-31 DIAGNOSIS — O321XX0 Maternal care for breech presentation, not applicable or unspecified: Secondary | ICD-10-CM | POA: Diagnosis present

## 2013-08-31 DIAGNOSIS — O09529 Supervision of elderly multigravida, unspecified trimester: Secondary | ICD-10-CM | POA: Diagnosis present

## 2013-08-31 NOTE — Progress Notes (Signed)
Ext. Cardio and toco removed. Pt to US for BPP via W/C with support person present.

## 2013-08-31 NOTE — MAU Note (Addendum)
Contractions since Thurs but more regular this afternoon. No leaking fld. Some brownish red d/c since exam Thurs. Baby is breech. Scheduled for primary C/S on Friday 2/6

## 2013-09-01 ENCOUNTER — Encounter (HOSPITAL_COMMUNITY)
Admission: AD | Disposition: A | Discharge status: Home / Self Care | Payer: Self-pay | Source: Ambulatory Visit | Attending: Obstetrics and Gynecology

## 2013-09-01 ENCOUNTER — Inpatient Hospital Stay (HOSPITAL_COMMUNITY): Payer: Medicaid Other | Admitting: Anesthesiology

## 2013-09-01 ENCOUNTER — Encounter (HOSPITAL_COMMUNITY): Payer: Medicaid Other | Admitting: Anesthesiology

## 2013-09-01 ENCOUNTER — Encounter (HOSPITAL_COMMUNITY): Payer: Self-pay | Admitting: *Deleted

## 2013-09-01 DIAGNOSIS — O09529 Supervision of elderly multigravida, unspecified trimester: Secondary | ICD-10-CM

## 2013-09-01 DIAGNOSIS — O321XX0 Maternal care for breech presentation, not applicable or unspecified: Secondary | ICD-10-CM

## 2013-09-01 DIAGNOSIS — O99334 Smoking (tobacco) complicating childbirth: Secondary | ICD-10-CM

## 2013-09-01 LAB — CBC
HCT: 36.7 % (ref 36.0–46.0)
HCT: 30.4 % — ABNORMAL LOW (ref 36.0–46.0)
Hemoglobin: 12.6 g/dL (ref 12.0–15.0)
Hemoglobin: 10.3 g/dL — ABNORMAL LOW (ref 12.0–15.0)
MCH: 32.6 pg (ref 26.0–34.0)
MCH: 32.5 pg (ref 26.0–34.0)
MCHC: 34.3 g/dL (ref 30.0–36.0)
MCHC: 33.9 g/dL (ref 30.0–36.0)
MCV: 95.1 fL (ref 78.0–100.0)
MCV: 95.9 fL (ref 78.0–100.0)
PLATELETS: 389 10*3/uL (ref 150–400)
Platelets: 314 10*3/uL (ref 150–400)
RBC: 3.17 MIL/uL — ABNORMAL LOW (ref 3.87–5.11)
RBC: 3.86 MIL/uL — AB (ref 3.87–5.11)
RDW: 13.5 % (ref 11.5–15.5)
RDW: 13.4 % (ref 11.5–15.5)
WBC: 19.4 10*3/uL — ABNORMAL HIGH (ref 4.0–10.5)
WBC: 22.2 10*3/uL — AB (ref 4.0–10.5)

## 2013-09-01 LAB — TYPE AND SCREEN
ABO/RH(D): A POS
ANTIBODY SCREEN: NEGATIVE

## 2013-09-01 LAB — COMPREHENSIVE METABOLIC PANEL
ALBUMIN: 2.4 g/dL — AB (ref 3.5–5.2)
ALK PHOS: 138 U/L — AB (ref 39–117)
ALT: 7 U/L (ref 0–35)
AST: 13 U/L (ref 0–37)
BUN: 9 mg/dL (ref 6–23)
CO2: 23 mEq/L (ref 19–32)
Calcium: 8.9 mg/dL (ref 8.4–10.5)
Chloride: 99 mEq/L (ref 96–112)
Creatinine, Ser: 0.66 mg/dL (ref 0.50–1.10)
GFR calc Af Amer: >90 mL/min (ref >90)
GFR calc non Af Amer: >90 mL/min (ref >90)
Glucose, Bld: 79 mg/dL (ref 70–99)
POTASSIUM: 3.9 meq/L (ref 3.7–5.3)
SODIUM: 136 meq/L — AB (ref 137–147)
Total Bilirubin: 0.4 mg/dL (ref 0.3–1.2)
Total Protein: 6.4 g/dL (ref 6.0–8.3)

## 2013-09-01 LAB — ABO/RH: ABO/RH(D): A POS

## 2013-09-01 SURGERY — Surgical Case
Anesthesia: Spinal

## 2013-09-01 MED ORDER — SIMETHICONE 80 MG PO CHEW: 80.0000 mg | CHEWABLE_TABLET | ORAL | Status: DC | PRN

## 2013-09-01 MED ORDER — TETANUS-DIPHTH-ACELL PERTUSSIS 5-2.5-18.5 LF-MCG/0.5 IM SUSP: 0.5000 mL | Freq: Once | INTRAMUSCULAR | Status: DC

## 2013-09-01 MED ORDER — NALBUPHINE HCL 10 MG/ML IJ SOLN: 5.0000 mg | INTRAMUSCULAR | Status: DC | PRN

## 2013-09-01 MED ORDER — KETOROLAC TROMETHAMINE 30 MG/ML IJ SOLN
30.0000 mg | Freq: Four times a day (QID) | INTRAMUSCULAR | Status: AC | PRN
  Administered 2013-09-01: 30 mg via INTRAVENOUS
  Filled 2013-09-01: qty 1

## 2013-09-01 MED ORDER — DIPHENHYDRAMINE HCL 50 MG/ML IJ SOLN: 25.0000 mg | INTRAMUSCULAR | Status: DC | PRN

## 2013-09-01 MED ORDER — DIBUCAINE 1 % RE OINT: 1.0000 "application " | TOPICAL_OINTMENT | RECTAL | Status: DC | PRN

## 2013-09-01 MED ORDER — SCOPOLAMINE 1 MG/3DAYS TD PT72
MEDICATED_PATCH | TRANSDERMAL | Status: AC
  Filled 2013-09-01: qty 1

## 2013-09-01 MED ORDER — FENTANYL CITRATE 0.05 MG/ML IJ SOLN
INTRAMUSCULAR | Status: DC | PRN
  Administered 2013-09-01: 12.5 ug via INTRATHECAL

## 2013-09-01 MED ORDER — FENTANYL CITRATE 0.05 MG/ML IJ SOLN
INTRAMUSCULAR | Status: AC
  Filled 2013-09-01: qty 2

## 2013-09-01 MED ORDER — KETOROLAC TROMETHAMINE 60 MG/2ML IM SOLN
60.0000 mg | Freq: Once | INTRAMUSCULAR | Status: AC | PRN
  Administered 2013-09-01: 60 mg via INTRAMUSCULAR

## 2013-09-01 MED ORDER — MORPHINE SULFATE 0.5 MG/ML IJ SOLN
INTRAMUSCULAR | Status: AC
  Filled 2013-09-01: qty 10

## 2013-09-01 MED ORDER — PRENATAL MULTIVITAMIN CH
1.0000 | ORAL_TABLET | Freq: Every day | ORAL | Status: DC
  Administered 2013-09-01 – 2013-09-03 (×3): 1 via ORAL
  Filled 2013-09-01 (×3): qty 1

## 2013-09-01 MED ORDER — METOCLOPRAMIDE HCL 5 MG/ML IJ SOLN: 10.0000 mg | Freq: Three times a day (TID) | INTRAMUSCULAR | Status: DC | PRN

## 2013-09-01 MED ORDER — LACTATED RINGERS IV SOLN
INTRAVENOUS | Status: DC
  Administered 2013-09-01: 11:00:00 via INTRAVENOUS

## 2013-09-01 MED ORDER — OXYCODONE-ACETAMINOPHEN 5-325 MG PO TABS
1.0000 | ORAL_TABLET | ORAL | Status: DC | PRN
  Administered 2013-09-01: 2 via ORAL
  Administered 2013-09-02: 1 via ORAL
  Administered 2013-09-02: 2 via ORAL
  Administered 2013-09-02: 1 via ORAL
  Administered 2013-09-02: 2 via ORAL
  Administered 2013-09-03 (×2): 1 via ORAL
  Filled 2013-09-01: qty 1
  Filled 2013-09-01 (×3): qty 2
  Filled 2013-09-01: qty 1
  Filled 2013-09-01: qty 2
  Filled 2013-09-01: qty 1

## 2013-09-01 MED ORDER — DIPHENHYDRAMINE HCL 25 MG PO CAPS: 25.0000 mg | ORAL_CAPSULE | ORAL | Status: DC | PRN

## 2013-09-01 MED ORDER — KETOROLAC TROMETHAMINE 60 MG/2ML IM SOLN
INTRAMUSCULAR | Status: AC
  Filled 2013-09-01: qty 2

## 2013-09-01 MED ORDER — FENTANYL CITRATE 0.05 MG/ML IJ SOLN: 25.0000 ug | INTRAMUSCULAR | Status: DC | PRN

## 2013-09-01 MED ORDER — KETOROLAC TROMETHAMINE 30 MG/ML IJ SOLN: 30.0000 mg | Freq: Four times a day (QID) | INTRAMUSCULAR | Status: AC | PRN

## 2013-09-01 MED ORDER — CITRIC ACID-SODIUM CITRATE 334-500 MG/5ML PO SOLN
ORAL | Status: AC
  Filled 2013-09-01: qty 15

## 2013-09-01 MED ORDER — ONDANSETRON HCL 4 MG/2ML IJ SOLN: 4.0000 mg | Freq: Three times a day (TID) | INTRAMUSCULAR | Status: DC | PRN

## 2013-09-01 MED ORDER — BUPIVACAINE IN DEXTROSE 0.75-8.25 % IT SOLN
INTRATHECAL | Status: DC | PRN
  Administered 2013-09-01: 1.4 mL via INTRATHECAL

## 2013-09-01 MED ORDER — CEFAZOLIN SODIUM-DEXTROSE 2-3 GM-% IV SOLR
2.0000 g | Freq: Once | INTRAVENOUS | Status: AC
  Administered 2013-09-01: 2 g via INTRAVENOUS
  Filled 2013-09-01: qty 50

## 2013-09-01 MED ORDER — CEFAZOLIN SODIUM-DEXTROSE 2-3 GM-% IV SOLR: 2.0000 g | INTRAVENOUS | Status: DC

## 2013-09-01 MED ORDER — MENTHOL 3 MG MT LOZG: 1.0000 | LOZENGE | OROMUCOSAL | Status: DC | PRN

## 2013-09-01 MED ORDER — OXYTOCIN 10 UNIT/ML IJ SOLN
40.0000 [IU] | INTRAVENOUS | Status: DC | PRN
  Administered 2013-09-01: 40 [IU] via INTRAVENOUS

## 2013-09-01 MED ORDER — SIMETHICONE 80 MG PO CHEW
80.0000 mg | CHEWABLE_TABLET | Freq: Three times a day (TID) | ORAL | Status: DC
  Administered 2013-09-01 – 2013-09-03 (×7): 80 mg via ORAL
  Filled 2013-09-01 (×7): qty 1

## 2013-09-01 MED ORDER — ONDANSETRON HCL 4 MG PO TABS: 4.0000 mg | ORAL_TABLET | ORAL | Status: DC | PRN

## 2013-09-01 MED ORDER — NALOXONE HCL 0.4 MG/ML IJ SOLN: 0.4000 mg | INTRAMUSCULAR | Status: DC | PRN

## 2013-09-01 MED ORDER — MORPHINE SULFATE (PF) 0.5 MG/ML IJ SOLN
INTRAMUSCULAR | Status: DC | PRN
  Administered 2013-09-01: .1 mg via INTRATHECAL

## 2013-09-01 MED ORDER — WITCH HAZEL-GLYCERIN EX PADS: 1.0000 "application " | MEDICATED_PAD | CUTANEOUS | Status: DC | PRN

## 2013-09-01 MED ORDER — NALOXONE HCL 1 MG/ML IJ SOLN
1.0000 ug/kg/h | INTRAVENOUS | Status: DC | PRN
  Filled 2013-09-01: qty 2

## 2013-09-01 MED ORDER — CITRIC ACID-SODIUM CITRATE 334-500 MG/5ML PO SOLN
30.0000 mL | Freq: Once | ORAL | Status: AC
  Administered 2013-09-01: 30 mL via ORAL

## 2013-09-01 MED ORDER — SODIUM CHLORIDE 0.9 % IJ SOLN: 3.0000 mL | INTRAMUSCULAR | Status: DC | PRN

## 2013-09-01 MED ORDER — FAMOTIDINE IN NACL 20-0.9 MG/50ML-% IV SOLN
20.0000 mg | Freq: Once | INTRAVENOUS | Status: AC
  Administered 2013-09-01: 20 mg via INTRAVENOUS
  Filled 2013-09-01: qty 50

## 2013-09-01 MED ORDER — IBUPROFEN 600 MG PO TABS
600.0000 mg | ORAL_TABLET | Freq: Four times a day (QID) | ORAL | Status: DC
  Administered 2013-09-01 – 2013-09-03 (×9): 600 mg via ORAL
  Filled 2013-09-01 (×9): qty 1

## 2013-09-01 MED ORDER — SIMETHICONE 80 MG PO CHEW
80.0000 mg | CHEWABLE_TABLET | ORAL | Status: DC
  Administered 2013-09-03: 80 mg via ORAL
  Filled 2013-09-01: qty 1

## 2013-09-01 MED ORDER — LANOLIN HYDROUS EX OINT: 1.0000 "application " | TOPICAL_OINTMENT | CUTANEOUS | Status: DC | PRN

## 2013-09-01 MED ORDER — SCOPOLAMINE 1 MG/3DAYS TD PT72
1.0000 | MEDICATED_PATCH | Freq: Once | TRANSDERMAL | Status: DC
  Administered 2013-09-01: 1.5 mg via TRANSDERMAL

## 2013-09-01 MED ORDER — ONDANSETRON HCL 4 MG/2ML IJ SOLN
INTRAMUSCULAR | Status: DC | PRN
  Administered 2013-09-01: 4 mg via INTRAVENOUS

## 2013-09-01 MED ORDER — LACTATED RINGERS IV SOLN
INTRAVENOUS | Status: DC
  Administered 2013-09-01 (×2): via INTRAVENOUS

## 2013-09-01 MED ORDER — DIPHENHYDRAMINE HCL 25 MG PO CAPS: 25.0000 mg | ORAL_CAPSULE | Freq: Four times a day (QID) | ORAL | Status: DC | PRN

## 2013-09-01 MED ORDER — ONDANSETRON HCL 4 MG/2ML IJ SOLN: 4.0000 mg | INTRAMUSCULAR | Status: DC | PRN

## 2013-09-01 MED ORDER — SENNOSIDES-DOCUSATE SODIUM 8.6-50 MG PO TABS
2.0000 | ORAL_TABLET | ORAL | Status: DC
  Administered 2013-09-02 – 2013-09-03 (×2): 2 via ORAL
  Filled 2013-09-01 (×2): qty 2

## 2013-09-01 MED ORDER — DIPHENHYDRAMINE HCL 50 MG/ML IJ SOLN: 12.5000 mg | INTRAMUSCULAR | Status: DC | PRN

## 2013-09-01 MED ORDER — PHENYLEPHRINE 8 MG IN D5W 100 ML (0.08MG/ML) PREMIX OPTIME
INJECTION | INTRAVENOUS | Status: DC | PRN
  Administered 2013-09-01: 60 ug/min via INTRAVENOUS

## 2013-09-01 MED ORDER — PNEUMOCOCCAL VAC POLYVALENT 25 MCG/0.5ML IJ INJ
0.5000 mL | INJECTION | INTRAMUSCULAR | Status: AC
  Administered 2013-09-02: 0.5 mL via INTRAMUSCULAR
  Filled 2013-09-01: qty 0.5

## 2013-09-01 MED ORDER — MEPERIDINE HCL 25 MG/ML IJ SOLN: 6.2500 mg | INTRAMUSCULAR | Status: DC | PRN

## 2013-09-01 MED ORDER — OXYTOCIN 40 UNITS IN LACTATED RINGERS INFUSION - SIMPLE MED
62.5000 mL/h | INTRAVENOUS | Status: AC
Start: 2013-09-01 — End: 2013-09-01

## 2013-09-01 SURGICAL SUPPLY — 29 items
CLAMP CORD UMBIL (MISCELLANEOUS) IMPLANT
CONTAINER PREFILL 10% NBF 15ML (MISCELLANEOUS) IMPLANT
DRAPE LG THREE QUARTER DISP (DRAPES) IMPLANT
DRSG OPSITE POSTOP 4X10 (GAUZE/BANDAGES/DRESSINGS) ×2 IMPLANT
DURAPREP 26ML APPLICATOR (WOUND CARE) ×2 IMPLANT
ELECT REM PT RETURN 9FT ADLT (ELECTROSURGICAL) ×2
ELECTRODE REM PT RTRN 9FT ADLT (ELECTROSURGICAL) ×1 IMPLANT
EXTRACTOR VACUUM M CUP 4 TUBE (SUCTIONS) IMPLANT
GLOVE BIOGEL PI IND STRL 6.5 (GLOVE) ×1 IMPLANT
GLOVE BIOGEL PI INDICATOR 6.5 (GLOVE) ×1
GLOVE SS BIOGEL STRL SZ 6.5 (GLOVE) ×2 IMPLANT
GLOVE SUPERSENSE BIOGEL SZ 6.5 (GLOVE) ×2
GLOVE SURG SS PI 6.0 STRL IVOR (GLOVE) ×2 IMPLANT
GOWN STRL REUS W/TWL LRG LVL3 (GOWN DISPOSABLE) ×4 IMPLANT
KIT ABG SYR 3ML LUER SLIP (SYRINGE) ×2 IMPLANT
NEEDLE HYPO 25X5/8 SAFETYGLIDE (NEEDLE) IMPLANT
NS IRRIG 1000ML POUR BTL (IV SOLUTION) ×2 IMPLANT
PACK C SECTION WH (CUSTOM PROCEDURE TRAY) ×2 IMPLANT
PAD OB MATERNITY 4.3X12.25 (PERSONAL CARE ITEMS) ×2 IMPLANT
RTRCTR C-SECT PINK 25CM LRG (MISCELLANEOUS) IMPLANT
SEPRAFILM MEMBRANE 5X6 (MISCELLANEOUS) IMPLANT
STAPLER VISISTAT 35W (STAPLE) IMPLANT
SUT PLAIN 0 NONE (SUTURE) IMPLANT
SUT PLAIN 2 0 XLH (SUTURE) ×2 IMPLANT
SUT VIC AB 0 CT1 36 (SUTURE) ×10 IMPLANT
SUT VIC AB 4-0 KS 27 (SUTURE) ×2 IMPLANT
TOWEL OR 17X24 6PK STRL BLUE (TOWEL DISPOSABLE) ×2 IMPLANT
TRAY FOLEY CATH 14FR (SET/KITS/TRAYS/PACK) ×2 IMPLANT
WATER STERILE IRR 1000ML POUR (IV SOLUTION) ×2 IMPLANT

## 2013-09-01 NOTE — Transfer of Care (Signed)
Immediate Anesthesia Transfer of Care Note  Patient: Traci Love  Procedure(s) Performed: Procedure(s): CESAREAN SECTION (N/A)  Patient Location: PACU  Anesthesia Type:Spinal  Level of Consciousness: awake, alert  and oriented  Airway & Oxygen Therapy: Patient Spontanous Breathing  Post-op Assessment: Report given to PACU RN  Post vital signs: Reviewed and stable  Complications: No apparent anesthesia complications

## 2013-09-01 NOTE — Progress Notes (Signed)
applied

## 2013-09-01 NOTE — Progress Notes (Signed)
Clinical Social Work Department PSYCHOSOCIAL ASSESSMENT - MATERNAL/CHILD 09/01/2013  Patient:  Traci Love, Traci Love  Account Number:  [id number]  Admit Date:  08/31/2013  Ardine Eng Name:   Norman Herrlich    Clinical Social Worker:  Deaunte Dente, LCSW   Date/Time:  09/01/2013 11:00 AM  Date Referred:  09/01/2013   Referral source  Central Nursery     Referred reason  [facility]   Other referral source:    I:  FAMILY / [place] legal guardian:  PARENT  Guardian - Name Guardian - Age Guardian - Address  Gutmann,Marella [address]  [address], [place] [address]  Lillia Pauls  same as above   Other household support members/support persons Other support:    II  PSYCHOSOCIAL DATA Information Source:    Occupational hygienist Employment:   Both parents employed   Museum/gallery curator resources:  [employer] [place]:   Other  [place] / Grade:   Maternity Care Coordinator / Child Services Coordination / Early Interventions:  Cultural issues impacting care:    III  STRENGTHS Strengths  Supportive family/friends  Home prepared for Child (including basic supplies)  Adequate Resources   Strength comment:    IV  RISK FACTORS AND CURRENT PROBLEMS Current Problem:     Risk Factor & Current Problem Patient Issue Family Issue Risk Factor / Current Problem Comment   N N     V  SOCIAL WORK ASSESSMENT Met with mother to discuss reason for no prenatal care. Spouse was present and very attentive.   Mother states that she had limited [facility] because she was unaware of the pregnancy.  She reportedly attributed her symptoms to pre menopause symptoms.   Mother states that she was surprise by the pregnancy.  Parents have 3 other children ages 65, 51, and 65.  Both parents are employed.  She denies any hx of mental illness or substance abuse.    Parents informed of the hospital's drug screening policy.    No acute social concerns related at this time.  Parents informed of  Social Work availability.      VI SOCIAL WORK PLAN Social Work Plan  No Barriers to Discharge   Type of pt/family education:   If child protective services report - county:   If child protective services report - date:   Information/referral to community resources comment:   Other social work plan:    Will continue to monitor UDS.

## 2013-09-01 NOTE — Anesthesia Procedure Notes (Signed)
Spinal Patient location during procedure: OR Staffing Anesthesiologist: Eugine Bubb Performed by: anesthesiologist  Preanesthetic Checklist Completed: patient identified, site marked, surgical consent, pre-op evaluation, timeout performed, IV checked, risks and benefits discussed and monitors and equipment checked Spinal Block Patient position: sitting Prep: ChloraPrep Patient monitoring: heart rate, continuous pulse ox and blood pressure Approach: right paramedian Location: L4-5 Injection technique: single-shot Needle Needle type: Sprotte  Needle gauge: 24 G Needle length: 9 cm Additional Notes Expiration date of kit checked and confirmed. Patient tolerated procedure well, without complications.     

## 2013-09-01 NOTE — Anesthesia Postprocedure Evaluation (Signed)
  Anesthesia Post-op Note  Patient: Traci Love  Procedure(s) Performed: Procedure(s) (LRB): CESAREAN SECTION (N/A)  Patient Location: PACU  Anesthesia Type: Spinal  Level of Consciousness: awake and alert   Airway and Oxygen Therapy: Patient Spontanous Breathing  Post-op Pain: mild  Post-op Assessment: Post-op Vital signs reviewed, Patient's Cardiovascular Status Stable, Respiratory Function Stable, Patent Airway and No signs of Nausea or vomiting  Last Vitals:  Filed Vitals:   09/01/13 0345  BP: 118/68  Pulse: 78  Temp:   Resp: 16    Post-op Vital Signs: stable   Complications: No apparent anesthesia complications

## 2013-09-01 NOTE — Op Note (Signed)
Traci AliasAbigail Love PROCEDURE DATE: 08/31/2013 - 09/01/2013  PREOPERATIVE DIAGNOSIS: Intrauterine pregnancy at  5647w3d weeks gestation; non-reassuring fetal status and fetal malpresentation  POSTOPERATIVE DIAGNOSIS: The same  PROCEDURE:     Cesarean Section  SURGEON:  Dr. Catalina AntiguaPeggy Anaijah Augsburger  ASSISTANT: Dr. Rulon AbideKeli Beck  INDICATIONS: Traci Aliasbigail Love is a 38 y.o. [id number] at 5047w3d scheduled for cesarean section secondary to malpresentation: frank breech and non-reassuring fetal status (BPP 4/10).  The risks of cesarean section discussed with the patient included but were not limited to: bleeding which may require transfusion or reoperation; infection which may require antibiotics; injury to bowel, bladder, ureters or other surrounding organs; injury to the fetus; need for additional procedures including hysterectomy in the event of a life-threatening hemorrhage; placental abnormalities wth subsequent pregnancies, incisional problems, thromboembolic phenomenon and other postoperative/anesthesia complications. The patient concurred with the proposed plan, giving informed written consent for the procedure.    FINDINGS:  Viable female infant in cephalic presentation.  Apgars 8 and 9.  Meconium stained amniotic fluid (thick).  Intact placenta, three vessel cord.  Normal uterus, fallopian tubes and ovaries bilaterally.  ANESTHESIA:    Spinal INTRAVENOUS FLUIDS:1600 ml ESTIMATED BLOOD LOSS: 700 ml URINE OUTPUT:  75 ml SPECIMENS: Placenta sent to L&D COMPLICATIONS: None immediate  PROCEDURE IN DETAIL:  The patient received intravenous antibiotics and had sequential compression devices applied to her lower extremities while in the preoperative area.  She was then taken to the operating room where anesthesia was induced and was found to be adequate. A foley catheter was placed into her bladder and attached to Brocha Gilliam gravity. She was then placed in a dorsal supine position with a leftward tilt, and prepped and draped in a  sterile manner. After an adequate timeout was performed, a Pfannenstiel skin incision was made with scalpel and carried through to the underlying layer of fascia. The fascia was incised in the midline and this incision was extended bilaterally using the Mayo scissors. Kocher clamps were applied to the superior aspect of the fascial incision and the underlying rectus muscles were dissected off bluntly. A similar process was carried out on the inferior aspect of the facial incision. The rectus muscles were separated in the midline bluntly and the peritoneum was entered bluntly. The Alexis self-retaining retractor was introduced into the abdominal cavity. Attention was turned to the lower uterine segment where a bladder flap was created, and a transverse hysterotomy was made with a scalpel and extended bilaterally bluntly. The infant was successfully delivered via breech extraction, and cord was clamped and cut and infant was handed over to awaiting neonatology team. Uterine massage was then administered and the placenta delivered intact with three-vessel cord. The uterus was cleared of clot and debris.  The hysterotomy was closed with 0 Vicryl in a running locked fashion, and an imbricating layer was also placed with a 0 Vicryl. Overall, excellent hemostasis was noted. The pelvis copiously irrigated and cleared of all clot and debris. Hemostasis was confirmed on all surfaces.  The fascia was then closed using 0 Vicryl in a running locked fashion.  The subcutaneous layer was reapproximated with plain gut and the skin was closed in a subcuticular fashion using 3.0 Vicryl. The patient tolerated the procedure well. Sponge, lap, instrument and needle counts were correct x 2. She was taken to the recovery room in stable condition.    Robin Petrakis,PEGGYMD  09/01/2013 2:18 AM

## 2013-09-01 NOTE — Anesthesia Preprocedure Evaluation (Addendum)
Anesthesia Evaluation  Patient identified by MRN, date of birth, ID band Patient awake    Reviewed: Allergy & Precautions, H&P , NPO status , Patient's Chart, lab work & pertinent test results  Airway Mallampati: II TM Distance: >3 FB Neck ROM: Full    Dental no notable dental hx. (+) Poor Dentition and Partial Upper   Pulmonary Current Smoker,  breath sounds clear to auscultation  Pulmonary exam normal       Cardiovascular negative cardio ROS  Rhythm:Regular Rate:Normal     Neuro/Psych negative neurological ROS  negative psych ROS   GI/Hepatic negative GI ROS, Neg liver ROS,   Endo/Other  negative endocrine ROS  Renal/GU negative Renal ROS  negative genitourinary   Musculoskeletal negative musculoskeletal ROS (+)   Abdominal   Peds negative pediatric ROS (+)  Hematology negative hematology ROS (+)   Anesthesia Other Findings   Reproductive/Obstetrics (+) Pregnancy                          Anesthesia Physical Anesthesia Plan  ASA: II and emergent  Anesthesia Plan: Spinal   Post-op Pain Management:    Induction:   Airway Management Planned:   Additional Equipment:   Intra-op Plan:   Post-operative Plan:   Informed Consent: I have reviewed the patients History and Physical, chart, labs and discussed the procedure including the risks, benefits and alternatives for the proposed anesthesia with the patient or authorized representative who has indicated his/her understanding and acceptance.   Dental advisory given  Plan Discussed with:   Anesthesia Plan Comments:         Anesthesia Quick Evaluation

## 2013-09-01 NOTE — Progress Notes (Signed)
Notified bpp 4/8, -2 for breathing and movement, fetus transverse w/ head to maternal left, subj low amniotic fluid, AFI: 14cm. Notified Dr. Jolayne Pantheronstant who will notify OR. Pt last ate @ 1900.   Cheral MarkerKimberly R. Mazen Marcin, CNM, [facility] 09/01/2013 12:53 AM

## 2013-09-01 NOTE — H&P (Signed)
Patient did not sign BTL papers on 08/08/2013 due to lack of active Medicaid. Plan for interval BTL.  Attestation of Attending Supervision of Advanced Practitioner (CNM/NP): Evaluation and management procedures were performed by the Advanced Practitioner under my supervision and collaboration.  I have reviewed the Advanced Practitioner's note and chart, and I agree with the management and plan.  Icess Bertoni 09/01/2013 2:34 AM

## 2013-09-01 NOTE — Progress Notes (Signed)
Notified by RN that pt came in for labor check, hasn't changed cervix, but is having occ variables. Will get BPP.   Cheral MarkerKimberly R. Booker, CNM, [facility] 08/31/13

## 2013-09-01 NOTE — Anesthesia Postprocedure Evaluation (Signed)
  Anesthesia Post-op Note  Patient: Traci AliasAbigail Love  Procedure(s) Performed: Procedure(s): CESAREAN SECTION (N/A)  Patient Location: Mother/Baby  Anesthesia Type:Spinal  Level of Consciousness: awake and alert   Airway and Oxygen Therapy: Patient Spontanous Breathing  Post-op Pain: mild  Post-op Assessment: Patient's Cardiovascular Status Stable, Respiratory Function Stable, No signs of Nausea or vomiting, Pain level controlled, No headache, No residual numbness and No residual motor weakness  Post-op Vital Signs: stable  Complications: No apparent anesthesia complications

## 2013-09-01 NOTE — H&P (Signed)
Traci Love is a 38 y.o. [id number] female at [redacted]w[redacted]d by 36wk u/s, presenting for initial labor eval by RN. No cervical change, but was having occ variables, so she was sent for a BPP that returned 4/8, -2 for breathing and movement. Infant presenting transverse w/ head to maternal Lt, subj low amniotic fluid, AFI 14cm.  Reports normal fm. Denies vb or lof. Pt last ate @ 1900.  Denies ha, scotomata, ruq/epigastric pain, n/v.  States bp's have been slightly elevated in clinic.      Initiated prenatal care at [facility] at 37.0 wks.  Pregnancy complicated by insufficient and late pnc, ama, LGSIL pap, and initially breech presentation.  H/O 2 previous term SVDs  Past Medical History: History reviewed. No pertinent past medical history.  Past Surgical History: History reviewed. No pertinent past surgical history.  Obstetrical History: OB History   Grav Para Term Preterm Abortions TAB SAB Ect Mult Living   5 2 2  2  2   2       Social History: History   Social History  . Marital Status: Married    Spouse Name: N/A    Number of Children: N/A  . Years of Education: N/A   Social History Main Topics  . Smoking status: Current Every Day Smoker -- 0.25 packs/day    Types: Cigarettes  . Smokeless tobacco: Never Used  . Alcohol Use: No  . Drug Use: No  . Sexual Activity: Yes   Other Topics Concern  . None   Social History Narrative  . None    Family History: Family History  Problem Relation Age of Onset  . Hypertension Mother   . Heart disease Mother   . Cancer Father     lung  . Diabetes Maternal Aunt     Allergies: No Known Allergies  Facility-administered medications prior to admission  Medication Dose Route Frequency Provider Last Rate Last Dose  . Tdap (BOOSTRIX) injection 0.5 mL  0.5 mL Intramuscular Once Reva Bores, MD       Prescriptions prior to admission  Medication Sig Dispense Refill  . Prenatal Vit-Fe Fumarate-FA (PRENATAL VITAMINS PLUS) 27-1 MG TABS Take 1  tablet by mouth.         Review of Systems  Pertinent pos/neg as indicated in HPI  Blood pressure 134/94, pulse 82, temperature 98.3 F (36.8 C), temperature source Oral, resp. rate 22, height 5\' 6"  (1.676 m), weight 104.418 kg (230 lb 3.2 oz), last menstrual period 12/29/2012. General appearance: alert, cooperative and no distress Lungs: clear to auscultation bilaterally Heart: regular rate and rhythm Abdomen: gravid, soft, non-tender Extremities: No edema DTR's 2+  Fetal monitoring: FHR: 155 bpm, variability: moderate,  Accelerations: Present,  decelerations:  Present variables Uterine activity: q 2-55mins  Dilation: 2 Effacement (%): 50;60 Station: -3 Exam by:: Sharen Hint RN Presentation: transverse   Prenatal labs: ABO, Rh: A/POS/-- (01/08 1642) Antibody: NEG (01/08 1642) Rubella:  Immune  RPR: NON REAC (01/08 1642)  HBsAg: NEGATIVE (01/08 1642)  HIV: NON REACTIVE (01/08 1642)  GBS: Negative (01/22 0000)   1 hr Glucola: 99 Genetic screening:  Too late Anatomy US: limited views d/t late GA, heart poorly visualized, calcified placenta   No results found for this or any previous visit (from the past 24 hour(s)).   Assessment:  [redacted]w[redacted]d SIUP  [id number]  Early labor  BPP 4/8  Cat 2 FHR  GBS Negative (01/22 0000)  LGSIL pap  Plan:  To OR for PLTCS d/t BPP  4/8 and transverse fetal presentation  Desires BTL, papers signed 1/8 per sticky note, no consent under media tab  CBC, CMP, P:C ratio ordered  Needs pp colpo  Marge DuncansBooker, Demonica Farrey Randall CNM, WHNP-BC 09/01/2013, 12:57 AM

## 2013-09-02 ENCOUNTER — Encounter (HOSPITAL_COMMUNITY): Payer: Self-pay | Admitting: Obstetrics and Gynecology

## 2013-09-02 DIAGNOSIS — Z3490 Encounter for supervision of normal pregnancy, unspecified, unspecified trimester: Secondary | ICD-10-CM

## 2013-09-02 NOTE — Progress Notes (Signed)
UR chart review completed.  

## 2013-09-02 NOTE — Progress Notes (Signed)
Post Partum Day 1 Subjective: s/p PLTCS for breech presentation. No complaints.  Passing flatus, urinating without difficulty, tolerating PO.   Objective: Blood pressure 118/76, pulse 80, temperature 98.3 F (36.8 C), temperature source Oral, resp. rate 18, height 5\' 6"  (1.676 m), weight 104.418 kg (230 lb 3.2 oz), last menstrual period 12/29/2012, SpO2 94.00%, unknown if currently breastfeeding.  Physical Exam:  General: alert, cooperative, appears stated age and no distress Lochia: appropriate Uterine Fundus: soft, appropriately tender Incision: no significant drainage, no dehiscence, no significant erythema DVT Evaluation: No evidence of DVT seen on physical exam. No cords or calf tenderness. No significant calf/ankle edema.   Recent Labs  09/01/13 0050 09/01/13 1149  HGB 12.6 10.3*  HCT 36.7 30.4*    Assessment/Plan: Plan for discharge tomorrow  MOC: BTL in about 6 wks. MOF: bottle   LOS: 2 days   Quincy SimmondsFeeney, Patricia L 09/02/2013, 7:06 AM   I have seen and examined this patient and agree with above documentation in the resident's note.   Rulon AbideKeli Kenshawn Maciolek, M.D. [facility] 09/02/2013 8:05 AM

## 2013-09-03 LAB — RPR: RPR Ser Ql: NONREACTIVE

## 2013-09-03 MED ORDER — OXYCODONE-ACETAMINOPHEN 5-325 MG PO TABS: 1.0000 | ORAL_TABLET | ORAL | Status: AC | PRN

## 2013-09-03 MED ORDER — IBUPROFEN 600 MG PO TABS: 600.0000 mg | ORAL_TABLET | Freq: Four times a day (QID) | ORAL | Status: AC

## 2013-09-03 NOTE — Discharge Summary (Signed)
Obstetric Discharge Summary Reason for Admission: onset of contractions without cervical change, NRFHT. BPP of 4/8 breech presentation Prenatal Procedures: none Intrapartum Procedures: cesarean: low cervical, transverse for breech Postpartum Procedures: none Complications-Operative and Postpartum: none Hemoglobin  Date Value Range Status  09/01/2013 10.3* 12.0 - 15.0 g/dL Final     REPEATED TO VERIFY     DELTA CHECK NOTED     HCT  Date Value Range Status  09/01/2013 30.4* 36.0 - 46.0 % Final    Physical Exam:  General: alert, cooperative and no distress Lochia: appropriate Uterine Fundus: firm Incision: healing well, no significant drainage, no dehiscence, no significant erythema DVT Evaluation: No evidence of DVT seen on physical exam. No cords or calf tenderness. No significant calf/ankle edema.  Discharge Diagnoses: Term Pregnancy-delivered  Discharge Information: Date: 09/03/2013 Activity: pelvic rest Diet: routine Medications: PNV, Ibuprofen, Colace and Percocet Condition: stable Instructions: refer to practice specific booklet Discharge to: home Follow-up Information   [facility] Schedule an appointment as soon as possible for a visit in 4 weeks.   Specialty:  Obstetrics and Gynecology   Contact information:   [address] [place] [address] [phone]      Newborn Data: Live born female  Birth Weight: 5 lb 11.5 oz (2595 g) APGAR: 8, 9  Home with mother.  Pt presented to [facility] with contractions and minimal cervical change but a FHT with variables.  A BPP was obtained and was 4/8. Fetus was in breech presentation.  Given this, she underwent primary LTCS with delivery of a female infant.  Post op care has been uncomplicated.  She is bottle feeding and plans for interval tubal.    Traci Love L 09/03/2013, 7:25 AM

## 2013-09-03 NOTE — Discharge Instructions (Signed)
Cesarean Delivery °Care After °Refer to this sheet in the next few weeks. These instructions provide you with information on caring for yourself after your procedure. Your health care provider may also give you specific instructions. Your treatment has been planned according to current medical practices, but problems sometimes occur. Call your health care provider if you have any problems or questions after you go home. °HOME CARE INSTRUCTIONS  °· Only take over-the-counter or prescription medications as directed by your health care provider. °· Do not drink alcohol, especially if you are breastfeeding or taking medication to relieve pain. °· Do not chew or smoke tobacco. °· Continue to use good perineal care. Good perineal care includes: °· Wiping your perineum from front to back. °· Keeping your perineum clean. °· Check your surgical cut (incision) daily for increased redness, drainage, swelling, or separation of skin. °· Clean your incision gently with soap and water every day, and then pat it dry. If your health care provider says it is OK, leave the incision uncovered. Use a bandage (dressing) if the incision is draining fluid or appears irritated. If the adhesive strips across the incision do not fall off within 7 days, carefully peel them off. °· Hug a pillow when coughing or sneezing until your incision is healed. This helps to relieve pain. °· Do not use tampons or douche until your health care provider says it is okay. °· Shower, wash your hair, and take tub baths as directed by your health care provider. °· Wear a well-fitting bra that provides breast support. °· Limit wearing support panties or control-top hose. °· Drink enough fluids to keep your urine clear or pale yellow. °· Eat high-fiber foods such as whole grain cereals and breads, brown rice, beans, and fresh fruits and vegetables every day. These foods may help prevent or relieve constipation. °· Resume activities such as climbing stairs,  driving, lifting, exercising, or traveling as directed by your health care provider. °· Talk to your health care provider about resuming sexual activities. This is dependent upon your risk of infection, your rate of healing, and your comfort and desire to resume sexual activity. °· Try to have someone help you with your household activities and your newborn for at least a few days after you leave the hospital. °· Rest as much as possible. Try to rest or take a nap when your newborn is sleeping. °· Increase your activities gradually. °· Keep all of your scheduled postpartum appointments. It is very important to keep your scheduled follow-up appointments. At these appointments, your health care provider will be checking to make sure that you are healing physically and emotionally. °SEEK MEDICAL CARE IF:  °· You are passing large clots from your vagina. Save any clots to show your health care provider. °· You have a foul smelling discharge from your vagina. °· You have trouble urinating. °· You are urinating frequently. °· You have pain when you urinate. °· You have a change in your bowel movements. °· You have increasing redness, pain, or swelling near your incision. °· You have pus draining from your incision. °· Your incision is separating. °· You have painful, hard, or reddened breasts. °· You have a severe headache. °· You have blurred vision or see spots. °· You feel sad or depressed. °· You have thoughts of hurting yourself or your newborn. °· You have questions about your care, the care of your newborn, or medications. °· You are dizzy or lightheaded. °· You have a rash. °· You   have pain, redness, or swelling at the site of the removed intravenous access (IV) tube. °· You have nausea or vomiting. °· You stopped breastfeeding and have not had a menstrual period within 12 weeks of stopping. °· You are not breastfeeding and have not had a menstrual period within 12 weeks of delivery. °· You have a fever. °SEEK  IMMEDIATE MEDICAL CARE IF: °· You have persistent pain. °· You have chest pain. °· You have shortness of breath. °· You faint. °· You have leg pain. °· You have stomach pain. °· Your vaginal bleeding saturates 2 or more sanitary pads in 1 hour. °MAKE SURE YOU:  °· Understand these instructions. °· Will watch your condition. °· Will get help right away if you are not doing well or get worse. °Document Released: 04/09/2002 Document Revised: 03/20/2013 Document Reviewed: 03/14/2012 °Exit[employer]® Patient Information ©2014 [employer]. ° ° ° °

## 2013-09-03 NOTE — Discharge Summary (Signed)
Attestation of Attending Supervision of Fellow: Evaluation and management procedures were performed by the Fellow under my supervision and collaboration.  I have reviewed the Fellow's note and chart, and I agree with the management and plan.    

## 2013-09-05 ENCOUNTER — Ambulatory Visit (HOSPITAL_COMMUNITY): Payer: Medicaid Other

## 2013-09-05 ENCOUNTER — Encounter: Payer: Medicaid Other | Admitting: Obstetrics & Gynecology

## 2013-09-10 ENCOUNTER — Telehealth: Payer: Self-pay

## 2013-09-10 NOTE — Telephone Encounter (Signed)
Called pt. No answer. Left message stating we are calling to coordinate an appointment for her to come in this Thursday for a BP check. Stated it is important that we check her BP and asked for her to call clinic at her earliest convenience to get appointment scheduled.

## 2013-09-10 NOTE — Telephone Encounter (Signed)
Message copied by Louanna RawAMPBELL, Ruford Dudzinski M on Tue Sep 10, 2013  4:28 PM ------      Message from: Lesly DukesLEGGETT, KELLY H      Created: Tue Sep 10, 2013  4:02 PM       Pt needs to be seen by RN for BP check  She is 9 days pp with elevated BP at home visit.  Today is Tuesday.  She needs to be seen Thursday.            Pt s number [phone] ------

## 2013-09-11 NOTE — Telephone Encounter (Signed)
Attempted to call pt. No answer. Left message stating we would like her to come in tomorrow for a BP check, she can come in between [phone] or 1-430 but that we would like her to call clinic so that we can coordinate this.

## 2013-09-12 ENCOUNTER — Ambulatory Visit (INDEPENDENT_AMBULATORY_CARE_PROVIDER_SITE_OTHER): Payer: Medicaid Other

## 2013-09-12 VITALS — BP 156/95 | HR 55 | Ht 67.0 in | Wt 224.0 lb

## 2013-09-12 DIAGNOSIS — I10 Essential (primary) hypertension: Secondary | ICD-10-CM

## 2013-09-12 DIAGNOSIS — IMO0001 Reserved for inherently not codable concepts without codable children: Secondary | ICD-10-CM

## 2013-09-12 DIAGNOSIS — R030 Elevated blood-pressure reading, without diagnosis of hypertension: Principal | ICD-10-CM

## 2013-09-12 MED ORDER — HYDROCHLOROTHIAZIDE 25 MG PO TABS: 25.0000 mg | ORAL_TABLET | Freq: Every day | ORAL | Status: AC

## 2013-09-12 NOTE — Telephone Encounter (Signed)
Called patient, no answer. Left message to give us a call back at the clinics so we can schedule her an appt to get her BP checked

## 2013-09-12 NOTE — Progress Notes (Signed)
Pt here for a BP check.  Pt BP elevated today.  Per Dr. Debroah LoopArnold pt can start taking HCTZ 25mg  po daily and come in one week for a BP check to make sure that the medicine is working.  Pt then asks about getting her BTL cause she could not get because she did not have her Medicaid card.  Pt stated that she has Medicaid now and that her pregnancy Medicaid runs out the beginning of April.  I informed pt that the BTL papers must be signed for 31 days before procedure is scheduled.  Pt stated understanding.  We signed BTL papers today and notified pt that she will discuss again with the provider the risks/benefits of the BTL. Pt left satisfied and with out further questions.

## 2013-09-12 NOTE — Telephone Encounter (Signed)
Letter sent to pt. To call clinic to coordinate a BP check appointment

## 2013-09-13 ENCOUNTER — Encounter: Payer: Self-pay | Admitting: *Deleted

## 2013-09-16 ENCOUNTER — Encounter: Payer: Medicaid Other | Admitting: Family Medicine

## 2013-09-30 ENCOUNTER — Ambulatory Visit (INDEPENDENT_AMBULATORY_CARE_PROVIDER_SITE_OTHER): Payer: Medicaid Other | Admitting: Nurse Practitioner

## 2013-09-30 ENCOUNTER — Encounter: Payer: Self-pay | Admitting: Nurse Practitioner

## 2013-09-30 DIAGNOSIS — Z309 Encounter for contraceptive management, unspecified: Secondary | ICD-10-CM

## 2013-09-30 DIAGNOSIS — E669 Obesity, unspecified: Secondary | ICD-10-CM

## 2013-09-30 NOTE — Progress Notes (Signed)
Patient ID: Traci Aliasbigail Bidinger, female   DOB: 01/18/1976, 38 y.o.   MRN: [id number] Subjective:     Traci Love is a 38 y.o. female who presents for a postpartum visit. She is 5 week postpartum following a low cervical transverse Cesarean section. I have fully reviewed the prenatal and intrapartum course. The delivery was at [redacted] weeks gestational weeks. Outcome: cesarean indication: breech. Anesthesia: spinal. Postpartum course has been uneventful.  Baby's course has been uneventful. Baby is feeding by bottle - gerber good start. Bleeding off and on. Bowel function is normal. Bladder function is normal. Patient is not sexually active. Contraception method is IUD. Postpartum depression screening: negative. 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   The following portions of the patient's history were reviewed and updated as appropriate: allergies, current medications, past family history, past medical history, past social history and past surgical history.  Review of Systems Pertinent items are noted in HPI.   Objective:    BP 136/88  Pulse 87  Temp(Src) 98.2 F (36.8 C)  Wt 211 lb 12.8 oz (96.072 kg)  Breastfeeding? No  General:  alert   Breasts:  inspection negative, no nipple discharge or bleeding, no masses or nodularity palpable and not examined  Lungs: clear to auscultation bilaterally  Heart:  regular rate and rhythm, S1, S2 normal, no murmur, click, rub or gallop  Abdomen: soft, non-tender; bowel sounds normal; no masses,  no organomegalyincision is well healing and not infected   Vulva:  not evaluated  Vagina: not evaluated  Cervix:  not examined  Corpus: not examined  Adnexa:  not examined  Rectal Exam: Not performed. and not examined        Assessment:     postpartum exam. Pap smear not done at today's visit.   Plan:    1. [facility]  3. Follow up in: 1 year or as needed.

## 2013-09-30 NOTE — Patient Instructions (Signed)
Contraception Choices °Birth control (contraception) is the use of any methods or devices to stop pregnancy from happening. Below are some methods to help avoid pregnancy. °HORMONAL BIRTH CONTROL °· A small tube put under the skin of the upper arm (implant). The tube can stay in place for 3 years. The implant must be taken out after 3 years. °· Shots given every 3 months. °· Pills taken every day. °· Patches that are changed once a week. °· A ring put into the vagina (vaginal ring). The ring is left in place for 3 weeks and removed for 1 week. Then, a new ring is put in the vagina. °· Emergency birth control pills taken after unprotected sex (intercourse). °BARRIER BIRTH CONTROL  °· A thin covering worn on the penis (female condom) during sex. °· A soft, loose covering put into the vagina (female condom) before sex. °· A rubber bowl that sits over the cervix (diaphragm). The bowl must be made for you. The bowl is put into the vagina before sex. The bowl is left in place for 6 to 8 hours after sex. °· A small, soft cup that fits over the cervix (cervical cap). The cup must be made for you. The cup can be left in place for 48 hours after sex. °· A sponge that is put into the vagina before sex. °· A chemical that kills or stops sperm from getting into the cervix and uterus (spermicide). The chemical may be a cream, jelly, foam, or pill. °INTRAUTERINE (IUD) BIRTH CONTROL  °· IUD birth control is a small, T-shaped piece of plastic. The plastic is put inside the uterus. There are 2 types of IUD: °· Copper IUD. The IUD is covered in copper wire. The copper makes a fluid that kills sperm. It can stay in place for 10 years. °· Hormone IUD. The hormone stops pregnancy from happening. It can stay in place for 5 years. °PERMANENT METHODS °· When the woman has her fallopian tubes sealed, tied, or blocked during surgery. This stops the egg from traveling to the uterus. °· The doctor places a small coil or insert into each fallopian  tube. This causes scar tissue to form and blocks the fallopian tubes. °· When the female has the tubes that carry sperm tied off (vasectomy). °NATURAL FAMILY PLANNING BIRTH CONTROL  °· Natural family planning means not having sex or using barrier birth control on the days the woman could become pregnant. °· Use a calendar to keep track of the length of each period and know the days she can get pregnant. °· Avoid sex during ovulation. °· Use a thermometer to measure body temperature. Also watch for symptoms of ovulation. °· Time sex to be after the woman has ovulated. °Use condoms to help protect yourself against sexually transmitted infections (STIs). Do this no matter what type of birth control you use. Talk to your doctor about which type of birth control is best for you. °Document Released: 05/15/2009 Document Revised: 03/20/2013 Document Reviewed: 02/06/2013 °[employer]® Patient Information ©2014 [employer]. ° °

## 2013-10-09 ENCOUNTER — Encounter: Payer: Self-pay | Admitting: *Deleted

## 2014-06-02 ENCOUNTER — Encounter: Payer: Self-pay | Admitting: Nurse Practitioner

## 2016-10-13 ENCOUNTER — Encounter: Payer: Self-pay | Admitting: Obstetrics

## 2016-10-13 ENCOUNTER — Other Ambulatory Visit: Payer: Self-pay | Admitting: Obstetrics

## 2016-10-15 LAB — CYTOLOGY - PAP

## 2016-11-10 ENCOUNTER — Other Ambulatory Visit: Payer: Self-pay | Admitting: Obstetrics

## 2022-01-03 ENCOUNTER — Other Ambulatory Visit: Payer: Self-pay

## 2022-01-03 ENCOUNTER — Emergency Department (HOSPITAL_BASED_OUTPATIENT_CLINIC_OR_DEPARTMENT_OTHER)
Admission: EM | Admit: 2022-01-03 | Discharge: 2022-01-03 | Disposition: A | Discharge status: Home / Self Care | Payer: Self-pay | Attending: Emergency Medicine | Admitting: Emergency Medicine

## 2022-01-03 ENCOUNTER — Encounter (HOSPITAL_BASED_OUTPATIENT_CLINIC_OR_DEPARTMENT_OTHER): Payer: Self-pay | Admitting: Obstetrics and Gynecology

## 2022-01-03 ENCOUNTER — Emergency Department (HOSPITAL_BASED_OUTPATIENT_CLINIC_OR_DEPARTMENT_OTHER): Payer: Self-pay | Admitting: Radiology

## 2022-01-03 DIAGNOSIS — S6991XA Unspecified injury of right wrist, hand and finger(s), initial encounter: Secondary | ICD-10-CM | POA: Insufficient documentation

## 2022-01-03 DIAGNOSIS — W231XXA Caught, crushed, jammed, or pinched between stationary objects, initial encounter: Secondary | ICD-10-CM | POA: Insufficient documentation

## 2022-01-03 NOTE — ED Triage Notes (Signed)
Patient reports to the ER for right pinky injury. Patient has it in a support because she thought she sprained it but is concerned it be more than sprained.

## 2022-01-03 NOTE — ED Notes (Signed)
Discharge paperwork given and understood. 

## 2022-01-03 NOTE — Discharge Instructions (Addendum)
Please keep your finger in a splint until follow up with hand surgery in clinic. NSAIDs for pain control.  You may have a tendon injury which could require operative repair

## 2022-01-03 NOTE — ED Provider Notes (Signed)
[facility] EMERGENCY DEPT Provider Note   CSN: [id number] Arrival date & time: 01/03/22  1455     History  Chief Complaint  Patient presents with   Hand Injury    Traci Love is a 46 y.o. female.   Hand Injury  46 year old female presenting to the emergency department, right-handed after an injury to her finger that occurred roughly 1 month ago.  The patient states that she jammed her right pinky finger 1 month ago and initially thought she had sprained it.  Since then, she has had progressively worsening difficulty with range of motion of the finger and has had worsening pain with attempts at range of motion of the finger.  She is concerned it may be fractured.  Home Medications Prior to Admission medications   Medication Sig Start Date End Date Taking? Authorizing Provider  hydrochlorothiazide (HYDRODIURIL) 25 MG tablet Take 1 tablet (25 mg total) by mouth daily. 09/12/13   Adam Phenix, MD  ibuprofen (ADVIL,MOTRIN) 600 MG tablet Take 1 tablet (600 mg total) by mouth every 6 (six) hours. 09/03/13   Vale Haven, MD  oxyCODONE-acetaminophen (PERCOCET/ROXICET) 5-325 MG per tablet Take 1-2 tablets by mouth every 4 (four) hours as needed for severe pain (moderate - severe pain). 09/03/13   Vale Haven, MD  Prenatal Vit-Fe Fumarate-FA (PRENATAL VITAMINS PLUS) 27-1 MG TABS Take 1 tablet by mouth.    [provider]      Allergies    Patient has no known allergies.    Review of Systems   Review of Systems  All other systems reviewed and are negative.  Physical Exam Updated Vital Signs BP (!) 157/90 (BP Location: Left Arm)   Pulse 81   Temp 97.9 F (36.6 C)   Resp 14   Ht 5\' 8"  (1.727 m)   Wt 108.9 kg   LMP 12/30/2021 (Approximate)   SpO2 100%   BMI 36.49 kg/m  Physical Exam Vitals and nursing note reviewed.  Constitutional:      General: She is not in acute distress. HENT:     Head: Normocephalic and atraumatic.  Eyes:      Conjunctiva/sclera: Conjunctivae normal.     Pupils: Pupils are equal, round, and reactive to light.  Cardiovascular:     Rate and Rhythm: Normal rate and regular rhythm.  Pulmonary:     Effort: Pulmonary effort is normal. No respiratory distress.  Abdominal:     General: There is no distension.     Tenderness: There is no guarding.  Musculoskeletal:        General: Tenderness and deformity present. No signs of injury.     Cervical back: Neck supple.     Comments: Right fifth digit with tenderness palpation about the DIP joint, pain with attempts at flexion of the DIP joint, mild swan-neck deformity, right hand is neurovascularly intact, 2+ radial pulses, intact motor function and sensation beyond the previously mentioned difficulties with the fifth digit  Skin:    Findings: No lesion or rash.  Neurological:     General: No focal deficit present.     Mental Status: She is alert. Mental status is at baseline.    ED Results / Procedures / Treatments   Labs (all labs ordered are listed, but only abnormal results are displayed) Labs Reviewed - No data to display  EKG None  Radiology DG Finger Little Right  Result Date: 01/03/2022 CLINICAL DATA:  46 year old female with injury to right fifth digit. EXAM: RIGHT  LITTLE FINGER 2+V COMPARISON:  None Available. FINDINGS: There is no evidence of fracture or dislocation. There is no evidence of arthropathy or other focal bone abnormality. Soft tissues are unremarkable. IMPRESSION: No acute fracture or malalignment. Electronically Signed   By: Marliss Coots M.D.   On: 01/03/2022 16:21    Procedures Procedures    Medications Ordered in ED Medications - No data to display  ED Course/ Medical Decision Making/ A&P                           Medical Decision Making Amount and/or Complexity of Data Reviewed Radiology: ordered.    46 year old female presenting to the emergency department, right-handed after an injury to her finger that  occurred roughly 1 month ago.  The patient states that she jammed her right pinky finger 1 month ago and initially thought she had sprained it.  Since then, she has had progressively worsening difficulty with range of motion of the finger and has had worsening pain with attempts at range of motion of the finger.  She is concerned it may be fractured.  On arrival, the patient was vitally stable.  Physical exam concerning for possible swan-neck deformity.  Patient injury occurred roughly 1 month ago.  She presents with continued pain and difficulty with range of motion.  She is neurovascular intact with tenderness palpation about the DIP joint with pain on attempts at flexion of the DIP joint.  My differential diagnosis includes swan-neck deformity from mallet finger or boutonniere deformity.  X-ray imaging was performed which revealed no fracture of the fifth digit on the right.  The patient will be placed in a splint Which she has provided herself and a referral to hand surgery was placed.  Advised continued NSAIDs for pain control and outpatient hand surgery follow-up.    Final Clinical Impression(s) / ED Diagnoses Final diagnoses:  Injury of finger of right hand, initial encounter    Rx / DC Orders ED Discharge Orders          Ordered    Ambulatory referral to Hand Surgery        01/03/22 1801              Ernie Avena, MD 01/03/22 1807

## 2022-06-10 ENCOUNTER — Other Ambulatory Visit: Payer: Self-pay

## 2022-06-10 ENCOUNTER — Emergency Department (HOSPITAL_COMMUNITY)
Admission: EM | Admit: 2022-06-10 | Discharge: 2022-06-10 | Disposition: A | Discharge status: Home / Self Care | Payer: No Typology Code available for payment source | Attending: Emergency Medicine | Admitting: Emergency Medicine

## 2022-06-10 ENCOUNTER — Emergency Department (HOSPITAL_COMMUNITY): Payer: No Typology Code available for payment source

## 2022-06-10 ENCOUNTER — Encounter (HOSPITAL_COMMUNITY): Payer: Self-pay | Admitting: Emergency Medicine

## 2022-06-10 DIAGNOSIS — S8002XA Contusion of left knee, initial encounter: Secondary | ICD-10-CM | POA: Insufficient documentation

## 2022-06-10 DIAGNOSIS — Z79899 Other long term (current) drug therapy: Secondary | ICD-10-CM | POA: Diagnosis not present

## 2022-06-10 DIAGNOSIS — Y92410 Unspecified street and highway as the place of occurrence of the external cause: Secondary | ICD-10-CM | POA: Diagnosis not present

## 2022-06-10 DIAGNOSIS — R079 Chest pain, unspecified: Secondary | ICD-10-CM | POA: Diagnosis not present

## 2022-06-10 DIAGNOSIS — S8992XA Unspecified injury of left lower leg, initial encounter: Secondary | ICD-10-CM | POA: Diagnosis present

## 2022-06-10 DIAGNOSIS — I10 Essential (primary) hypertension: Secondary | ICD-10-CM | POA: Insufficient documentation

## 2022-06-10 MED ORDER — AMLODIPINE BESYLATE 5 MG PO TABS: 5.0000 mg | ORAL_TABLET | Freq: Every day | ORAL | 0 refills | Status: AC
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
## 2023-10-26 ENCOUNTER — Other Ambulatory Visit: Payer: Self-pay | Admitting: Family Medicine

## 2023-10-26 DIAGNOSIS — Z0000 Encounter for general adult medical examination without abnormal findings: Secondary | ICD-10-CM

## 2023-10-27 ENCOUNTER — Ambulatory Visit
Admission: RE | Admit: 2023-10-27 | Discharge: 2023-10-27 | Disposition: A | Discharge status: Home / Self Care | Payer: Self-pay | Source: Ambulatory Visit | Attending: Family Medicine | Admitting: Family Medicine

## 2023-10-27 DIAGNOSIS — Z0000 Encounter for general adult medical examination without abnormal findings: Secondary | ICD-10-CM

## 2024-04-03 IMAGING — DX DG FINGER LITTLE 2+V*R*
3 series · 3 of 3 positions shown · non-contrast
Comparison: None Available.

CLINICAL DATA: 46-year-old female with injury to right fifth digit.

EXAM:
RIGHT LITTLE FINGER 2+V

[finger ap]
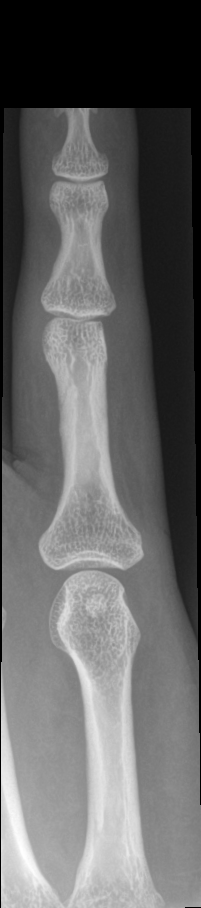

[finger obl]
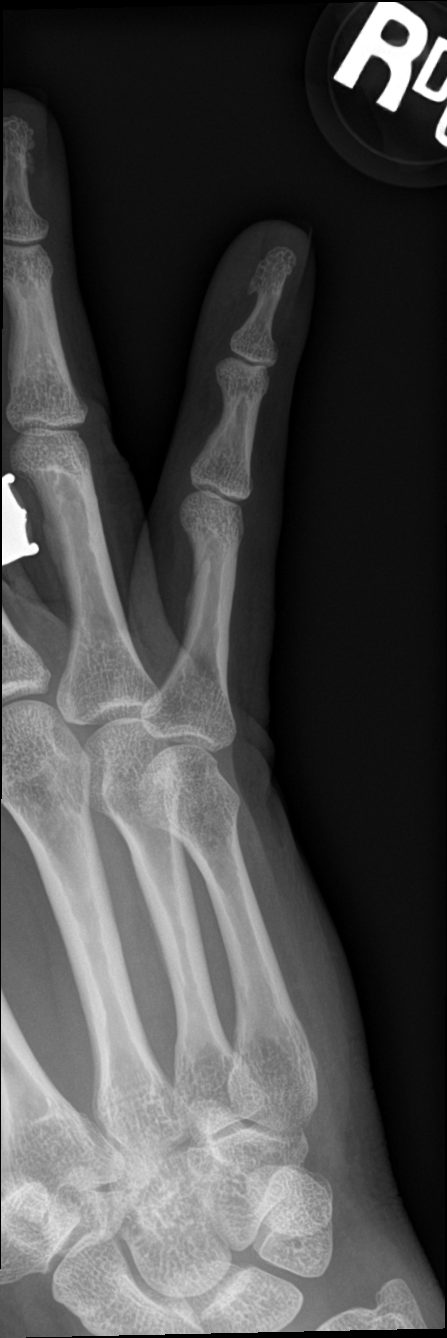

[finger lat]
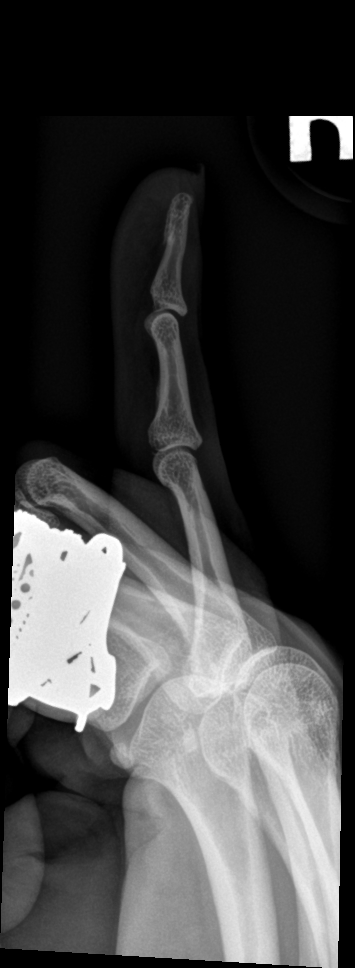

[3 of 3 positions shown; findings below may reference images not displayed]

FINDINGS: There is no evidence of fracture or dislocation. There is no
evidence of arthropathy or other focal bone abnormality. Soft
tissues are unremarkable.
IMPRESSION: No acute fracture or malalignment.
